# Patient Record
Sex: Female | Born: 1985 | Race: White | Hispanic: No | Marital: Married
Health system: Southern US, Community
[De-identification: ages and names within clinical notes are randomized; demographics above are authoritative.]

## PROBLEM LIST (undated history)

## (undated) DIAGNOSIS — A609 Anogenital herpesviral infection, unspecified: Secondary | ICD-10-CM

## (undated) DIAGNOSIS — E7212 Methylenetetrahydrofolate reductase deficiency: Secondary | ICD-10-CM

## (undated) DIAGNOSIS — J309 Allergic rhinitis, unspecified: Secondary | ICD-10-CM

## (undated) DIAGNOSIS — K589 Irritable bowel syndrome without diarrhea: Secondary | ICD-10-CM

## (undated) DIAGNOSIS — Z1589 Genetic susceptibility to other disease: Secondary | ICD-10-CM

## (undated) HISTORY — DX: Allergic rhinitis, unspecified: J30.9

## (undated) HISTORY — DX: Irritable bowel syndrome without diarrhea: K58.9

## (undated) HISTORY — PX: TONSILLECTOMY: SUR1361

## (undated) HISTORY — DX: Anogenital herpesviral infection, unspecified: A60.9

---
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
## 2013-12-09 ENCOUNTER — Emergency Department (HOSPITAL_COMMUNITY): Payer: BC Managed Care – PPO

## 2013-12-09 ENCOUNTER — Encounter (HOSPITAL_COMMUNITY): Payer: Self-pay | Admitting: Emergency Medicine

## 2013-12-09 ENCOUNTER — Emergency Department (HOSPITAL_COMMUNITY)
Admission: EM | Admit: 2013-12-09 | Discharge: 2013-12-09 | Disposition: A | Discharge status: Home / Self Care | Payer: BC Managed Care – PPO | Attending: Emergency Medicine | Admitting: Emergency Medicine

## 2013-12-09 DIAGNOSIS — O99330 Smoking (tobacco) complicating pregnancy, unspecified trimester: Secondary | ICD-10-CM | POA: Insufficient documentation

## 2013-12-09 DIAGNOSIS — R1032 Left lower quadrant pain: Secondary | ICD-10-CM | POA: Insufficient documentation

## 2013-12-09 DIAGNOSIS — Z79899 Other long term (current) drug therapy: Secondary | ICD-10-CM | POA: Diagnosis not present

## 2013-12-09 DIAGNOSIS — O9989 Other specified diseases and conditions complicating pregnancy, childbirth and the puerperium: Secondary | ICD-10-CM | POA: Insufficient documentation

## 2013-12-09 DIAGNOSIS — O210 Mild hyperemesis gravidarum: Secondary | ICD-10-CM | POA: Diagnosis not present

## 2013-12-09 DIAGNOSIS — N949 Unspecified condition associated with female genital organs and menstrual cycle: Secondary | ICD-10-CM | POA: Insufficient documentation

## 2013-12-09 DIAGNOSIS — Z3490 Encounter for supervision of normal pregnancy, unspecified, unspecified trimester: Secondary | ICD-10-CM

## 2013-12-09 LAB — CBC WITH DIFFERENTIAL/PLATELET
BASOS PCT: 0 % (ref 0–1)
Basophils Absolute: 0 10*3/uL (ref 0.0–0.1)
EOS ABS: 0 10*3/uL (ref 0.0–0.7)
EOS PCT: 0 % (ref 0–5)
HCT: 38.9 % (ref 36.0–46.0)
Hemoglobin: 13.4 g/dL (ref 12.0–15.0)
Lymphocytes Relative: 10 % — ABNORMAL LOW (ref 12–46)
Lymphs Abs: 1 10*3/uL (ref 0.7–4.0)
MCH: 31.3 pg (ref 26.0–34.0)
MCHC: 34.4 g/dL (ref 30.0–36.0)
MCV: 90.9 fL (ref 78.0–100.0)
Monocytes Absolute: 0.5 10*3/uL (ref 0.1–1.0)
Monocytes Relative: 5 % (ref 3–12)
Neutro Abs: 8.3 10*3/uL — ABNORMAL HIGH (ref 1.7–7.7)
Neutrophils Relative %: 85 % — ABNORMAL HIGH (ref 43–77)
PLATELETS: 254 10*3/uL (ref 150–400)
RBC: 4.28 MIL/uL (ref 3.87–5.11)
RDW: 12 % (ref 11.5–15.5)
WBC: 9.9 10*3/uL (ref 4.0–10.5)

## 2013-12-09 LAB — WET PREP, GENITAL
TRICH WET PREP: NONE SEEN
Yeast Wet Prep HPF POC: NONE SEEN

## 2013-12-09 LAB — I-STAT CHEM 8, ED
BUN: 15 mg/dL (ref 6–23)
Calcium, Ion: 1.11 mmol/L — ABNORMAL LOW (ref 1.12–1.23)
Chloride: 103 mEq/L (ref 96–112)
Creatinine, Ser: 0.6 mg/dL (ref 0.50–1.10)
GLUCOSE: 108 mg/dL — AB (ref 70–99)
HEMATOCRIT: 41 % (ref 36.0–46.0)
HEMOGLOBIN: 13.9 g/dL (ref 12.0–15.0)
POTASSIUM: 3.6 meq/L — AB (ref 3.7–5.3)
SODIUM: 137 meq/L (ref 137–147)
TCO2: 22 mmol/L (ref 0–100)

## 2013-12-09 LAB — URINALYSIS, ROUTINE W REFLEX MICROSCOPIC
Bilirubin Urine: NEGATIVE
Glucose, UA: NEGATIVE mg/dL
Hgb urine dipstick: NEGATIVE
Ketones, ur: >80 mg/dL — AB
LEUKOCYTES UA: NEGATIVE
NITRITE: NEGATIVE
PH: 7.5 (ref 5.0–8.0)
Protein, ur: NEGATIVE mg/dL
SPECIFIC GRAVITY, URINE: 1.025 (ref 1.005–1.030)
UROBILINOGEN UA: 1 mg/dL (ref 0.0–1.0)

## 2013-12-09 LAB — HCG, QUANTITATIVE, PREGNANCY: hCG, Beta Chain, Quant, S: 80 m[IU]/mL — ABNORMAL HIGH (ref <5)

## 2013-12-09 LAB — ABO/RH
ABO/RH(D): O NEG
ANTIBODY SCREEN: NEGATIVE

## 2013-12-09 LAB — POC URINE PREG, ED: PREG TEST UR: NEGATIVE

## 2013-12-09 MED ORDER — MORPHINE SULFATE 4 MG/ML IJ SOLN
4.0000 mg | Freq: Once | INTRAMUSCULAR | Status: AC
  Administered 2013-12-09: 4 mg via INTRAVENOUS
  Filled 2013-12-09: qty 1

## 2013-12-09 MED ORDER — PROMETHAZINE HCL 25 MG/ML IJ SOLN
6.2500 mg | Freq: Once | INTRAMUSCULAR | Status: AC
  Administered 2013-12-09: 6.25 mg via INTRAVENOUS
  Filled 2013-12-09: qty 1

## 2013-12-09 MED ORDER — HYDROCODONE-ACETAMINOPHEN 5-325 MG PO TABS
1.0000 | ORAL_TABLET | ORAL | Status: DC | PRN
Start: 2013-12-09 — End: 2015-02-24

## 2013-12-09 MED ORDER — SODIUM CHLORIDE 0.9 % IV BOLUS (SEPSIS)
1000.0000 mL | Freq: Once | INTRAVENOUS | Status: AC
  Administered 2013-12-09: 1000 mL via INTRAVENOUS

## 2013-12-09 NOTE — ED Provider Notes (Signed)
CSN: [id number]     Arrival date & time 12/09/13  0140 History   First MD Initiated Contact with Patient 12/09/13 0229     Chief Complaint  Patient presents with  . Abdominal Pain     (Consider location/radiation/quality/duration/timing/severity/associated sxs/prior Treatment) HPI Ashley Chapman is a 28 y.o. female who presents to emergency department complaining of left lower quadrant pain. Patient states her pain began approximately 10 hours ago. States he has worsened since. States pain is in the left pelvic area, does not radiate. She states that she took 2 pregnancy tests yesterday both came back positive. Last menstrual cycle was 11/04/2013. This is her first pregnancy. She denies any vaginal discharge or bleeding. She does admit to nausea and vomiting. Denies any changes in bowels. No history of similar pain in the past. She did not take anything for pain at home prior to coming in. She also reports urinary frequency and dysuria. No hematuria.  History reviewed. No pertinent past medical history. Past Surgical History  Procedure Laterality Date  . Tonsillectomy     No family history on file. History  Substance Use Topics  . Smoking status: Current Every Day Smoker  . Smokeless tobacco: Not on file  . Alcohol Use: No   OB History   Grav Para Term Preterm Abortions TAB SAB Ect Mult Living                 Review of Systems  Constitutional: Negative for fever and chills.  Respiratory: Negative for cough, chest tightness and shortness of breath.   Cardiovascular: Negative for chest pain, palpitations and leg swelling.  Gastrointestinal: Positive for nausea, vomiting and abdominal pain. Negative for diarrhea.  Genitourinary: Positive for pelvic pain. Negative for dysuria, flank pain, vaginal bleeding, vaginal discharge and vaginal pain.  Musculoskeletal: Negative for arthralgias, myalgias, neck pain and neck stiffness.  Skin: Negative for rash.  Neurological: Negative for  dizziness, weakness and headaches.  All other systems reviewed and are negative.     Allergies  Review of patient's allergies indicates no known allergies.  Home Medications   Prior to Admission medications   Medication Sig Start Date End Date Taking? Authorizing Provider  Multiple Vitamin (MULTIVITAMIN WITH MINERALS) TABS tablet Take 1 tablet by mouth daily.   Yes Historical Provider, MD   BP 125/82  Pulse 61  Temp(Src) 98.7 F (37.1 C) (Oral)  Resp 19  Ht  (1.651 m)  Wt 160 lb (72.576 kg)  BMI 26.63 kg/m2  SpO2 100%  LMP 11/04/2013 Physical Exam  Nursing note and vitals reviewed. Constitutional: She appears well-developed and well-nourished. No distress.  HENT:  Head: Normocephalic.  Eyes: Conjunctivae are normal.  Neck: Neck supple.  Cardiovascular: Normal rate, regular rhythm and normal heart sounds.   Pulmonary/Chest: Effort normal and breath sounds normal. No respiratory distress. She has no wheezes. She has no rales.  Abdominal: Soft. Bowel sounds are normal. She exhibits no distension. There is tenderness. There is no rebound and no guarding.  Left lower quadrant tenderness  Genitourinary:  Normal external genitalia. Normal vaginal canal. Small thin white discharge. Cervix is normal, closed. No CMT. No uterine tenderness. Left adnexal tenderness present. No masses palpated.    Musculoskeletal: She exhibits no edema.  Neurological: She is alert.  Skin: Skin is warm and dry.  Psychiatric: She has a normal mood and affect. Her behavior is normal.    ED Course  Procedures (including critical care time) Labs Review Labs Reviewed  WET PREP,  GENITAL - Abnormal; Notable for the following:    Clue Cells Wet Prep HPF POC FEW (*)    WBC, Wet Prep HPF POC FEW (*)    All other components within normal limits  URINALYSIS, ROUTINE W REFLEX MICROSCOPIC - Abnormal; Notable for the following:    APPearance CLOUDY (*)    Ketones, ur >80 (*)    All other components  within normal limits  CBC WITH DIFFERENTIAL - Abnormal; Notable for the following:    Neutrophils Relative % 85 (*)    Neutro Abs 8.3 (*)    Lymphocytes Relative 10 (*)    All other components within normal limits  HCG, QUANTITATIVE, PREGNANCY - Abnormal; Notable for the following:    hCG, Beta Chain, Quant, S 80 (*)    All other components within normal limits  I-STAT CHEM 8, ED - Abnormal; Notable for the following:    Potassium 3.6 (*)    Glucose, Bld 108 (*)    Calcium, Ion 1.11 (*)    All other components within normal limits  GC/CHLAMYDIA PROBE AMP  POC URINE PREG, ED  ABO/RH    Imaging Review US Ob Comp Less 14 Wks  12/09/2013   CLINICAL DATA:  LEFT adnexal pain, assess for ectopic pregnancy. Gestational age by last menstrual period 5 weeks and 0 days.  EXAM: OBSTETRIC <14 WK [place] AND TRANSVAGINAL OB  US DOPPLER ULTRASOUND OF OVARIES  TECHNIQUE: Both transabdominal and transvaginal ultrasound examinations were performed for complete evaluation of the gestation as well as the maternal uterus, adnexal regions, and pelvic cul-de-sac. Transvaginal technique was performed to assess early pregnancy.  Color and duplex Doppler ultrasound was utilized to evaluate blood flow to the ovaries.  COMPARISON:  None.  FINDINGS: Intrauterine gestational sac: Not visualized  Yolk sac:  Not visualized  Embryo:  Not visualized  Cardiac Activity: Not applicable  Maternal uterus/adnexae: The RIGHT ovary is 3.6 x 2 x 3 cm, LEFT ovary is 2.9 x 1.6 x 1.5 cm. No suspicious adnexal masses. Small to moderate amount of free fluid in the pelvis.  Pulsed Doppler evaluation of both ovaries demonstrates normal appearing low-resistance arterial and venous waveforms.  IMPRESSION: No sonographic findings of intrauterine nor ectopic pregnancy. Please note, gestational sac may be sonographically occult in early pregnancy. If clinically indicated, consider serial HCG and follow-up pelvic ultrasound in 10-12 days.  Small to  moderate around of free fluid in the pelvis could reflect a ruptured adnexal cyst.   Electronically Signed   By: Awilda Metro   On: 12/09/2013 06:00   US Ob Transvaginal  12/09/2013   CLINICAL DATA:  LEFT adnexal pain, assess for ectopic pregnancy. Gestational age by last menstrual period 5 weeks and 0 days.  EXAM: OBSTETRIC <14 WK [place] AND TRANSVAGINAL OB  US DOPPLER ULTRASOUND OF OVARIES  TECHNIQUE: Both transabdominal and transvaginal ultrasound examinations were performed for complete evaluation of the gestation as well as the maternal uterus, adnexal regions, and pelvic cul-de-sac. Transvaginal technique was performed to assess early pregnancy.  Color and duplex Doppler ultrasound was utilized to evaluate blood flow to the ovaries.  COMPARISON:  None.  FINDINGS: Intrauterine gestational sac: Not visualized  Yolk sac:  Not visualized  Embryo:  Not visualized  Cardiac Activity: Not applicable  Maternal uterus/adnexae: The RIGHT ovary is 3.6 x 2 x 3 cm, LEFT ovary is 2.9 x 1.6 x 1.5 cm. No suspicious adnexal masses. Small to moderate amount of free fluid in the pelvis.  Pulsed Doppler  evaluation of both ovaries demonstrates normal appearing low-resistance arterial and venous waveforms.  IMPRESSION: No sonographic findings of intrauterine nor ectopic pregnancy. Please note, gestational sac may be sonographically occult in early pregnancy. If clinically indicated, consider serial HCG and follow-up pelvic ultrasound in 10-12 days.  Small to moderate around of free fluid in the pelvis could reflect a ruptured adnexal cyst.   Electronically Signed   By: Awilda Metro   On: 12/09/2013 06:00   [place] Art/ven Flow Abd Pelv Doppler  12/09/2013   CLINICAL DATA:  LEFT adnexal pain, assess for ectopic pregnancy. Gestational age by last menstrual period 5 weeks and 0 days.  EXAM: OBSTETRIC <14 WK [place] AND TRANSVAGINAL OB  US DOPPLER ULTRASOUND OF OVARIES  TECHNIQUE: Both transabdominal and transvaginal ultrasound  examinations were performed for complete evaluation of the gestation as well as the maternal uterus, adnexal regions, and pelvic cul-de-sac. Transvaginal technique was performed to assess early pregnancy.  Color and duplex Doppler ultrasound was utilized to evaluate blood flow to the ovaries.  COMPARISON:  None.  FINDINGS: Intrauterine gestational sac: Not visualized  Yolk sac:  Not visualized  Embryo:  Not visualized  Cardiac Activity: Not applicable  Maternal uterus/adnexae: The RIGHT ovary is 3.6 x 2 x 3 cm, LEFT ovary is 2.9 x 1.6 x 1.5 cm. No suspicious adnexal masses. Small to moderate amount of free fluid in the pelvis.  Pulsed Doppler evaluation of both ovaries demonstrates normal appearing low-resistance arterial and venous waveforms.  IMPRESSION: No sonographic findings of intrauterine nor ectopic pregnancy. Please note, gestational sac may be sonographically occult in early pregnancy. If clinically indicated, consider serial HCG and follow-up pelvic ultrasound in 10-12 days.  Small to moderate around of free fluid in the pelvis could reflect a ruptured adnexal cyst.   Electronically Signed   By: Awilda Metro   On: 12/09/2013 06:00     EKG Interpretation None      MDM   Final diagnoses:  Left lower quadrant pain  Pregnancy    Patient's with 2 positive pregnancy test at home, here with left adnexal pain. Patient's urinalysis here showed negative pregnancy test. Will get blood hCG level, will get ultrasound for further evaluation.     Patient's ultrasound showed no sonographic findings of intrauterine pregnancy, no ectopic pregnancy seen as well. It may be too early to see pregnancy given her hCG level is 80. Have discussed findings with patient. Have discussed the fact that we cannot still rule out ectopic pregnancy. She will need close followup with her OB/GYN in 2 days for recheck of hCG. Patient is followed by [facility], she will call them and followup with them, if unable  to followup in 2 days she is to report to [facility] hospital. She will also need repeat ultrasound in 10-12 days as recommended by radiologist. At this time patient is more comfortable, pain is manageable. We'll discharge home with pain medications. Advised to go to [facility] hospital if any vaginal bleeding, increased pain, any new concerning symptoms. Pt agrees to the plan. Pt non toxic at this time. VS normal.   Filed Vitals:   12/09/13 0155 12/09/13 0537  BP: 125/82 107/67  Pulse: 61 66  Temp: 98.7 F (37.1 C)   TempSrc: Oral   Resp: 19 16  Height:  (1.651 m)   Weight: 160 lb (72.576 kg)   SpO2: 100% 100%     Lottie Mussel, PA-C 12/09/13 2114

## 2013-12-09 NOTE — ED Notes (Signed)
U/s at bedside

## 2013-12-09 NOTE — Discharge Instructions (Signed)
Your hcg today is 21, which is pretty low and consistent with 2-[redacted] wks gestation. Your ultrasound did not show an embryo because it may be too early. I is imperative to follow up in 2 days for recheck of HCG to make sure pregnancy progresses and repeat ultrasound to rule out ectopic/tubal pregnancy. Take tylenol for pain. Norco for severe pain only. [facility] if worsening pain, fever, vaginal bleeding.    Abdominal Pain During Pregnancy Abdominal pain is common in pregnancy. Most of the time, it does not cause harm. There are many causes of abdominal pain. Some causes are more serious than others. Some of the causes of abdominal pain in pregnancy are easily diagnosed. Occasionally, the diagnosis takes time to understand. Other times, the cause is not determined. Abdominal pain can be a sign that something is very wrong with the pregnancy, or the pain may have nothing to do with the pregnancy at all. For this reason, always tell your health care provider if you have any abdominal discomfort. HOME CARE INSTRUCTIONS  Monitor your abdominal pain for any changes. The following actions may help to alleviate any discomfort you are experiencing:  Do not have sexual intercourse or put anything in your vagina until your symptoms go away completely.  Get plenty of rest until your pain improves.  Drink clear fluids if you feel nauseous. Avoid solid food as long as you are uncomfortable or nauseous.  Only take over-the-counter or prescription medicine as directed by your health care provider.  Keep all follow-up appointments with your health care provider. SEEK IMMEDIATE MEDICAL CARE IF:  You are bleeding, leaking fluid, or passing tissue from the vagina.  You have increasing pain or cramping.  You have persistent vomiting.  You have painful or bloody urination.  You have a fever.  You notice a decrease in your baby's movements.  You have extreme weakness or feel faint.  You  have shortness of breath, with or without abdominal pain.  You develop a severe headache with abdominal pain.  You have abnormal vaginal discharge with abdominal pain.  You have persistent diarrhea.  You have abdominal pain that continues even after rest, or gets worse. MAKE SURE YOU:   Understand these instructions.  Will watch your condition.  Will get help right away if you are not doing well or get worse. Document Released: 03/04/2005 Document Revised: 12/23/2012 Document Reviewed: 10/01/2012 [facility] Patient Information 2015 [place], [place]. This information is not intended to replace advice given to you by your health care provider. Make sure you discuss any questions you have with your health care provider.

## 2013-12-09 NOTE — ED Notes (Signed)
Pt reports L pelvic pain with n/v since 1600 yesterday afternoon.  Pt reports she is [redacted] weeks pregnant.  Denies any bleeding but reports dysuria.

## 2013-12-09 NOTE — ED Notes (Signed)
Pt presents with LLQ abd pain onset yesteday @ 1600, + n/v, +dysuria. Pt states she took home preg test x 2 with pos result. LMP 8/20. Abd tender, worse LLQ. Diffuse tenderness.

## 2013-12-10 LAB — GC/CHLAMYDIA PROBE AMP
CT Probe RNA: NEGATIVE
GC Probe RNA: NEGATIVE

## 2013-12-10 NOTE — ED Provider Notes (Signed)
Medical screening examination/treatment/procedure(s) were performed by non-physician practitioner and as supervising physician I was immediately available for consultation/collaboration.   EKG Interpretation None        Purvis Sheffield, MD 12/10/13 1707

## 2014-08-01 LAB — OB RESULTS CONSOLE ABO/RH: RH TYPE: NEGATIVE

## 2014-08-01 LAB — OB RESULTS CONSOLE ANTIBODY SCREEN: Antibody Screen: NEGATIVE

## 2014-08-01 LAB — OB RESULTS CONSOLE RPR: RPR: NONREACTIVE

## 2014-08-01 LAB — OB RESULTS CONSOLE GC/CHLAMYDIA
CHLAMYDIA, DNA PROBE: NEGATIVE
Gonorrhea: NEGATIVE

## 2014-08-01 LAB — OB RESULTS CONSOLE RUBELLA ANTIBODY, IGM: Rubella: IMMUNE

## 2014-08-01 LAB — OB RESULTS CONSOLE HEPATITIS B SURFACE ANTIGEN: Hepatitis B Surface Ag: NEGATIVE

## 2014-08-01 LAB — OB RESULTS CONSOLE HIV ANTIBODY (ROUTINE TESTING): HIV: NONREACTIVE

## 2015-01-26 LAB — OB RESULTS CONSOLE GBS: STREP GROUP B AG: POSITIVE

## 2015-02-23 ENCOUNTER — Encounter (HOSPITAL_COMMUNITY): Payer: Self-pay | Admitting: *Deleted

## 2015-02-23 ENCOUNTER — Inpatient Hospital Stay (HOSPITAL_COMMUNITY)
Admission: AD | Admit: 2015-02-23 | Payer: Managed Care, Other (non HMO) | Source: Ambulatory Visit | Admitting: Obstetrics and Gynecology

## 2015-02-23 ENCOUNTER — Other Ambulatory Visit: Payer: Self-pay | Admitting: Certified Nurse Midwife

## 2015-02-23 ENCOUNTER — Telehealth (HOSPITAL_COMMUNITY): Payer: Self-pay | Admitting: *Deleted

## 2015-02-23 NOTE — Telephone Encounter (Signed)
Preadmission screen  

## 2015-02-24 ENCOUNTER — Inpatient Hospital Stay (HOSPITAL_COMMUNITY): Payer: BLUE CROSS/BLUE SHIELD | Admitting: Anesthesiology

## 2015-02-24 ENCOUNTER — Inpatient Hospital Stay (HOSPITAL_COMMUNITY)
Admission: RE | Admit: 2015-02-24 | Discharge: 2015-02-26 | DRG: 766 | Disposition: A | Discharge status: Home / Self Care | Payer: BLUE CROSS/BLUE SHIELD | Source: Ambulatory Visit | Attending: Obstetrics | Admitting: Obstetrics

## 2015-02-24 ENCOUNTER — Encounter (HOSPITAL_COMMUNITY): Payer: Self-pay

## 2015-02-24 DIAGNOSIS — O1424 HELLP syndrome, complicating childbirth: Secondary | ICD-10-CM | POA: Diagnosis present

## 2015-02-24 DIAGNOSIS — O2603 Excessive weight gain in pregnancy, third trimester: Secondary | ICD-10-CM | POA: Diagnosis present

## 2015-02-24 DIAGNOSIS — Z1589 Genetic susceptibility to other disease: Secondary | ICD-10-CM | POA: Diagnosis present

## 2015-02-24 DIAGNOSIS — O139 Gestational [pregnancy-induced] hypertension without significant proteinuria, unspecified trimester: Secondary | ICD-10-CM | POA: Diagnosis present

## 2015-02-24 DIAGNOSIS — Z800 Family history of malignant neoplasm of digestive organs: Secondary | ICD-10-CM | POA: Diagnosis not present

## 2015-02-24 DIAGNOSIS — O99824 Streptococcus B carrier state complicating childbirth: Secondary | ICD-10-CM | POA: Diagnosis present

## 2015-02-24 DIAGNOSIS — O2620 Pregnancy care for patient with recurrent pregnancy loss, unspecified trimester: Secondary | ICD-10-CM | POA: Diagnosis present

## 2015-02-24 DIAGNOSIS — O480 Post-term pregnancy: Secondary | ICD-10-CM | POA: Diagnosis present

## 2015-02-24 DIAGNOSIS — O134 Gestational [pregnancy-induced] hypertension without significant proteinuria, complicating childbirth: Secondary | ICD-10-CM | POA: Diagnosis present

## 2015-02-24 DIAGNOSIS — O3660X0 Maternal care for excessive fetal growth, unspecified trimester, not applicable or unspecified: Secondary | ICD-10-CM | POA: Diagnosis present

## 2015-02-24 DIAGNOSIS — Z3A40 40 weeks gestation of pregnancy: Secondary | ICD-10-CM

## 2015-02-24 DIAGNOSIS — Z842 Family history of other diseases of the genitourinary system: Secondary | ICD-10-CM

## 2015-02-24 DIAGNOSIS — E7212 Methylenetetrahydrofolate reductase deficiency: Secondary | ICD-10-CM | POA: Diagnosis present

## 2015-02-24 HISTORY — DX: Methylenetetrahydrofolate reductase deficiency: E72.12

## 2015-02-24 HISTORY — DX: Genetic susceptibility to other disease: Z15.89

## 2015-02-24 LAB — COMPREHENSIVE METABOLIC PANEL
ALT: 17 U/L (ref 14–54)
AST: 23 U/L (ref 15–41)
Albumin: 3.3 g/dL — ABNORMAL LOW (ref 3.5–5.0)
Alkaline Phosphatase: 116 U/L (ref 38–126)
Anion gap: 9 (ref 5–15)
BUN: 17 mg/dL (ref 6–20)
CO2: 19 mmol/L — ABNORMAL LOW (ref 22–32)
Calcium: 8.8 mg/dL — ABNORMAL LOW (ref 8.9–10.3)
Chloride: 103 mmol/L (ref 101–111)
Creatinine, Ser: 0.64 mg/dL (ref 0.44–1.00)
GFR calc Af Amer: >60 mL/min (ref >60)
GFR calc non Af Amer: >60 mL/min (ref >60)
Glucose, Bld: 89 mg/dL (ref 65–99)
Potassium: 3.8 mmol/L (ref 3.5–5.1)
Sodium: 131 mmol/L — ABNORMAL LOW (ref 135–145)
Total Bilirubin: 1.5 mg/dL — ABNORMAL HIGH (ref 0.3–1.2)
Total Protein: 6.3 g/dL — ABNORMAL LOW (ref 6.5–8.1)

## 2015-02-24 LAB — CBC
HCT: 38.5 % (ref 36.0–46.0)
HCT: 37 % (ref 36.0–46.0)
HCT: 38.5 % (ref 36.0–46.0)
HEMOGLOBIN: 13.2 g/dL (ref 12.0–15.0)
Hemoglobin: 13.1 g/dL (ref 12.0–15.0)
Hemoglobin: 12.6 g/dL (ref 12.0–15.0)
MCH: 31.5 pg (ref 26.0–34.0)
MCH: 31.7 pg (ref 26.0–34.0)
MCH: 32 pg (ref 26.0–34.0)
MCHC: 34 g/dL (ref 30.0–36.0)
MCHC: 34.1 g/dL (ref 30.0–36.0)
MCHC: 34.3 g/dL (ref 30.0–36.0)
MCV: 92.5 fL (ref 78.0–100.0)
MCV: 93.2 fL (ref 78.0–100.0)
MCV: 93.4 fL (ref 78.0–100.0)
PLATELETS: 151 10*3/uL (ref 150–400)
Platelets: 165 10*3/uL (ref 150–400)
Platelets: 140 10*3/uL — ABNORMAL LOW (ref 150–400)
RBC: 4.16 MIL/uL (ref 3.87–5.11)
RBC: 3.97 MIL/uL (ref 3.87–5.11)
RBC: 4.12 MIL/uL (ref 3.87–5.11)
RDW: 13.8 % (ref 11.5–15.5)
RDW: 13.9 % (ref 11.5–15.5)
RDW: 14.1 % (ref 11.5–15.5)
WBC: 16.3 10*3/uL — ABNORMAL HIGH (ref 4.0–10.5)
WBC: 12.7 10*3/uL — AB (ref 4.0–10.5)
WBC: 18 10*3/uL — ABNORMAL HIGH (ref 4.0–10.5)

## 2015-02-24 LAB — RPR: RPR Ser Ql: NONREACTIVE

## 2015-02-24 LAB — URIC ACID: Uric Acid, Serum: 6.4 mg/dL (ref 2.3–6.6)

## 2015-02-24 MED ORDER — SCOPOLAMINE 1 MG/3DAYS TD PT72
MEDICATED_PATCH | TRANSDERMAL | Status: AC
  Filled 2015-02-24: qty 1

## 2015-02-24 MED ORDER — NALOXONE HCL 0.4 MG/ML IJ SOLN: 0.4000 mg | INTRAMUSCULAR | Status: DC | PRN

## 2015-02-24 MED ORDER — DIPHENHYDRAMINE HCL 50 MG/ML IJ SOLN
12.5000 mg | INTRAMUSCULAR | Status: DC | PRN
Start: 2015-02-24 — End: 2015-02-24

## 2015-02-24 MED ORDER — BUPIVACAINE LIPOSOME 1.3 % IJ SUSP
20.0000 mL | Freq: Once | INTRAMUSCULAR | Status: DC
  Filled 2015-02-24: qty 20

## 2015-02-24 MED ORDER — ONDANSETRON HCL 4 MG/2ML IJ SOLN
INTRAMUSCULAR | Status: DC | PRN
  Administered 2015-02-24: 4 mg via INTRAVENOUS

## 2015-02-24 MED ORDER — LACTATED RINGERS IV SOLN
INTRAVENOUS | Status: DC | PRN
  Administered 2015-02-24: 20:00:00 via INTRAVENOUS

## 2015-02-24 MED ORDER — MISOPROSTOL 25 MCG QUARTER TABLET: 25.0000 ug | ORAL_TABLET | ORAL | Status: DC | PRN

## 2015-02-24 MED ORDER — DIPHENHYDRAMINE HCL 25 MG PO CAPS
25.0000 mg | ORAL_CAPSULE | Freq: Four times a day (QID) | ORAL | Status: DC | PRN
  Filled 2015-02-24: qty 1

## 2015-02-24 MED ORDER — MISOPROSTOL 25 MCG QUARTER TABLET
25.0000 ug | ORAL_TABLET | ORAL | Status: DC | PRN
  Administered 2015-02-24: 25 ug via VAGINAL
  Filled 2015-02-24 (×2): qty 0.25

## 2015-02-24 MED ORDER — SODIUM CHLORIDE 0.9 % IJ SOLN: 3.0000 mL | INTRAMUSCULAR | Status: DC | PRN

## 2015-02-24 MED ORDER — LACTATED RINGERS IV SOLN
INTRAVENOUS | Status: DC
  Administered 2015-02-24: 18:00:00 via INTRAVENOUS

## 2015-02-24 MED ORDER — PENICILLIN G POTASSIUM 5000000 UNITS IJ SOLR
5.0000 10*6.[IU] | Freq: Once | INTRAVENOUS | Status: DC
  Filled 2015-02-24: qty 5

## 2015-02-24 MED ORDER — SODIUM BICARBONATE 8.4 % IV SOLN
INTRAVENOUS | Status: DC | PRN
  Administered 2015-02-24 (×2): 5 mL via EPIDURAL

## 2015-02-24 MED ORDER — SIMETHICONE 80 MG PO CHEW
80.0000 mg | CHEWABLE_TABLET | ORAL | Status: DC | PRN
Start: 2015-02-24 — End: 2015-02-26
  Filled 2015-02-24: qty 1

## 2015-02-24 MED ORDER — NALBUPHINE HCL 10 MG/ML IJ SOLN: 5.0000 mg | INTRAMUSCULAR | Status: DC | PRN

## 2015-02-24 MED ORDER — OXYCODONE-ACETAMINOPHEN 5-325 MG PO TABS: 1.0000 | ORAL_TABLET | ORAL | Status: DC | PRN

## 2015-02-24 MED ORDER — NALBUPHINE HCL 10 MG/ML IJ SOLN
5.0000 mg | INTRAMUSCULAR | Status: DC | PRN
Start: 2015-02-24 — End: 2015-02-25

## 2015-02-24 MED ORDER — OXYTOCIN 40 UNITS IN LACTATED RINGERS INFUSION - SIMPLE MED: 62.5000 mL/h | INTRAVENOUS | Status: DC

## 2015-02-24 MED ORDER — OXYTOCIN BOLUS FROM INFUSION: 500.0000 mL | INTRAVENOUS | Status: DC

## 2015-02-24 MED ORDER — WITCH HAZEL-GLYCERIN EX PADS
1.0000 | MEDICATED_PAD | CUTANEOUS | Status: DC | PRN
Start: 2015-02-24 — End: 2015-02-26

## 2015-02-24 MED ORDER — MENTHOL 3 MG MT LOZG
1.0000 | LOZENGE | OROMUCOSAL | Status: DC | PRN
  Filled 2015-02-24: qty 9

## 2015-02-24 MED ORDER — MORPHINE SULFATE (PF) 0.5 MG/ML IJ SOLN
INTRAMUSCULAR | Status: DC | PRN
  Administered 2015-02-24: 4 mg via EPIDURAL
  Administered 2015-02-24: 1 mg via INTRAVENOUS

## 2015-02-24 MED ORDER — DIPHENHYDRAMINE HCL 50 MG/ML IJ SOLN: 12.5000 mg | INTRAMUSCULAR | Status: DC | PRN

## 2015-02-24 MED ORDER — MEPERIDINE HCL 25 MG/ML IJ SOLN: 6.2500 mg | INTRAMUSCULAR | Status: DC | PRN

## 2015-02-24 MED ORDER — NALBUPHINE HCL 10 MG/ML IJ SOLN: 5.0000 mg | Freq: Once | INTRAMUSCULAR | Status: DC | PRN

## 2015-02-24 MED ORDER — PENICILLIN G POTASSIUM 5000000 UNITS IJ SOLR
2.5000 10*6.[IU] | INTRAVENOUS | Status: DC
  Filled 2015-02-24 (×4): qty 2.5

## 2015-02-24 MED ORDER — DEXTROSE 5 % IV SOLN
2.0000 g | INTRAVENOUS | Status: AC
  Administered 2015-02-24: 2 g via INTRAVENOUS
  Filled 2015-02-24: qty 2

## 2015-02-24 MED ORDER — PRENATAL MULTIVITAMIN CH
1.0000 | ORAL_TABLET | Freq: Every day | ORAL | Status: DC
  Administered 2015-02-25 – 2015-02-26 (×2): 1 via ORAL
  Filled 2015-02-24 (×3): qty 1

## 2015-02-24 MED ORDER — SODIUM BICARBONATE 8.4 % IV SOLN
INTRAVENOUS | Status: AC
  Filled 2015-02-24: qty 50

## 2015-02-24 MED ORDER — LACTATED RINGERS IV SOLN
INTRAVENOUS | Status: DC
  Administered 2015-02-24 – 2015-02-25 (×2): via INTRAVENOUS

## 2015-02-24 MED ORDER — METHYLERGONOVINE MALEATE 0.2 MG/ML IJ SOLN: 0.2000 mg | INTRAMUSCULAR | Status: DC | PRN

## 2015-02-24 MED ORDER — LANOLIN HYDROUS EX OINT: 1.0000 "application " | TOPICAL_OINTMENT | CUTANEOUS | Status: DC | PRN

## 2015-02-24 MED ORDER — SCOPOLAMINE 1 MG/3DAYS TD PT72
MEDICATED_PATCH | TRANSDERMAL | Status: DC | PRN
  Administered 2015-02-24: 1 via TRANSDERMAL

## 2015-02-24 MED ORDER — IBUPROFEN 600 MG PO TABS
600.0000 mg | ORAL_TABLET | Freq: Four times a day (QID) | ORAL | Status: DC
  Administered 2015-02-25 – 2015-02-26 (×6): 600 mg via ORAL
  Filled 2015-02-24 (×6): qty 1

## 2015-02-24 MED ORDER — OXYCODONE-ACETAMINOPHEN 5-325 MG PO TABS: 2.0000 | ORAL_TABLET | ORAL | Status: DC | PRN

## 2015-02-24 MED ORDER — CITRIC ACID-SODIUM CITRATE 334-500 MG/5ML PO SOLN
30.0000 mL | ORAL | Status: DC | PRN
  Filled 2015-02-24: qty 15

## 2015-02-24 MED ORDER — ACETAMINOPHEN 325 MG PO TABS
650.0000 mg | ORAL_TABLET | ORAL | Status: DC | PRN
  Administered 2015-02-25 (×2): 650 mg via ORAL
  Filled 2015-02-24 (×2): qty 2

## 2015-02-24 MED ORDER — BUPIVACAINE HCL (PF) 0.25 % IJ SOLN
INTRAMUSCULAR | Status: AC
  Filled 2015-02-24: qty 20

## 2015-02-24 MED ORDER — OXYTOCIN 40 UNITS IN LACTATED RINGERS INFUSION - SIMPLE MED
1.0000 m[IU]/min | INTRAVENOUS | Status: DC
  Administered 2015-02-24: 2 m[IU]/min via INTRAVENOUS
  Filled 2015-02-24: qty 1000

## 2015-02-24 MED ORDER — DIPHENHYDRAMINE HCL 25 MG PO CAPS
25.0000 mg | ORAL_CAPSULE | ORAL | Status: DC | PRN
  Filled 2015-02-24: qty 1

## 2015-02-24 MED ORDER — ACETAMINOPHEN 325 MG PO TABS: 650.0000 mg | ORAL_TABLET | ORAL | Status: DC | PRN

## 2015-02-24 MED ORDER — SIMETHICONE 80 MG PO CHEW
80.0000 mg | CHEWABLE_TABLET | Freq: Three times a day (TID) | ORAL | Status: DC
  Administered 2015-02-25 – 2015-02-26 (×3): 80 mg via ORAL
  Filled 2015-02-24 (×7): qty 1

## 2015-02-24 MED ORDER — SCOPOLAMINE 1 MG/3DAYS TD PT72: 1.0000 | MEDICATED_PATCH | Freq: Once | TRANSDERMAL | Status: DC

## 2015-02-24 MED ORDER — KETOROLAC TROMETHAMINE 30 MG/ML IJ SOLN: 30.0000 mg | Freq: Four times a day (QID) | INTRAMUSCULAR | Status: DC | PRN

## 2015-02-24 MED ORDER — SENNOSIDES-DOCUSATE SODIUM 8.6-50 MG PO TABS
2.0000 | ORAL_TABLET | ORAL | Status: DC
  Administered 2015-02-25: 2 via ORAL
  Filled 2015-02-24 (×3): qty 2

## 2015-02-24 MED ORDER — NALBUPHINE HCL 10 MG/ML IJ SOLN
10.0000 mg | INTRAMUSCULAR | Status: AC
Start: 2015-02-24 — End: 2015-02-24
  Administered 2015-02-24: 10 mg via SUBCUTANEOUS
  Filled 2015-02-24: qty 1

## 2015-02-24 MED ORDER — MORPHINE SULFATE (PF) 0.5 MG/ML IJ SOLN
INTRAMUSCULAR | Status: AC
  Filled 2015-02-24: qty 10

## 2015-02-24 MED ORDER — ONDANSETRON HCL 4 MG/2ML IJ SOLN: 4.0000 mg | Freq: Three times a day (TID) | INTRAMUSCULAR | Status: DC | PRN

## 2015-02-24 MED ORDER — LACTATED RINGERS IV SOLN
40.0000 [IU] | INTRAVENOUS | Status: DC | PRN
  Administered 2015-02-24: 40 [IU] via INTRAVENOUS

## 2015-02-24 MED ORDER — LIDOCAINE HCL (PF) 1 % IJ SOLN: 30.0000 mL | INTRAMUSCULAR | Status: DC | PRN

## 2015-02-24 MED ORDER — ONDANSETRON HCL 4 MG/2ML IJ SOLN
INTRAMUSCULAR | Status: AC
  Filled 2015-02-24: qty 2

## 2015-02-24 MED ORDER — BUPIVACAINE HCL (PF) 0.25 % IJ SOLN
INTRAMUSCULAR | Status: DC | PRN
  Administered 2015-02-24: 20 mL

## 2015-02-24 MED ORDER — MEPERIDINE HCL 25 MG/ML IJ SOLN
INTRAMUSCULAR | Status: AC
  Filled 2015-02-24: qty 1

## 2015-02-24 MED ORDER — SODIUM CHLORIDE 0.9 % IJ SOLN: 3.0000 mL | Freq: Two times a day (BID) | INTRAMUSCULAR | Status: DC

## 2015-02-24 MED ORDER — SIMETHICONE 80 MG PO CHEW
80.0000 mg | CHEWABLE_TABLET | ORAL | Status: DC
  Filled 2015-02-24 (×2): qty 1

## 2015-02-24 MED ORDER — MEPERIDINE HCL 25 MG/ML IJ SOLN
INTRAMUSCULAR | Status: DC | PRN
  Administered 2015-02-24 (×2): 12.5 mg via INTRAVENOUS

## 2015-02-24 MED ORDER — TERBUTALINE SULFATE 1 MG/ML IJ SOLN: 0.2500 mg | Freq: Once | INTRAMUSCULAR | Status: DC | PRN

## 2015-02-24 MED ORDER — FENTANYL 2.5 MCG/ML BUPIVACAINE 1/10 % EPIDURAL INFUSION (WH - ANES)
14.0000 mL/h | INTRAMUSCULAR | Status: DC | PRN
  Administered 2015-02-24 (×2): 14 mL/h via EPIDURAL
  Filled 2015-02-24 (×2): qty 125

## 2015-02-24 MED ORDER — OXYTOCIN 10 UNIT/ML IJ SOLN
INTRAMUSCULAR | Status: AC
Start: 2015-02-24 — End: 2015-02-24
  Filled 2015-02-24: qty 4

## 2015-02-24 MED ORDER — FENTANYL CITRATE (PF) 100 MCG/2ML IJ SOLN: 25.0000 ug | INTRAMUSCULAR | Status: DC | PRN

## 2015-02-24 MED ORDER — LIDOCAINE-EPINEPHRINE (PF) 2 %-1:200000 IJ SOLN
INTRAMUSCULAR | Status: AC
  Filled 2015-02-24: qty 20

## 2015-02-24 MED ORDER — BUPIVACAINE LIPOSOME 1.3 % IJ SUSP
INTRAMUSCULAR | Status: DC | PRN
  Administered 2015-02-24: 20 mL

## 2015-02-24 MED ORDER — SODIUM CHLORIDE 0.9 % IV SOLN: 250.0000 mL | INTRAVENOUS | Status: DC | PRN

## 2015-02-24 MED ORDER — LACTATED RINGERS IV SOLN
500.0000 mL | INTRAVENOUS | Status: DC | PRN
  Administered 2015-02-24: 10:00:00 via INTRAVENOUS

## 2015-02-24 MED ORDER — SODIUM CHLORIDE 0.9 % IR SOLN
Status: DC | PRN
  Administered 2015-02-24: 1000 mL

## 2015-02-24 MED ORDER — EPHEDRINE 5 MG/ML INJ: 10.0000 mg | INTRAVENOUS | Status: DC | PRN

## 2015-02-24 MED ORDER — LIDOCAINE HCL (PF) 1 % IJ SOLN
INTRAMUSCULAR | Status: DC | PRN
Start: 2015-02-24 — End: 2015-02-24
  Administered 2015-02-24 (×2): 4 mL

## 2015-02-24 MED ORDER — ZOLPIDEM TARTRATE 5 MG PO TABS: 5.0000 mg | ORAL_TABLET | Freq: Every evening | ORAL | Status: DC | PRN

## 2015-02-24 MED ORDER — LACTATED RINGERS IV SOLN
INTRAVENOUS | Status: DC | PRN
  Administered 2015-02-24 (×2): via INTRAVENOUS

## 2015-02-24 MED ORDER — TETANUS-DIPHTH-ACELL PERTUSSIS 5-2.5-18.5 LF-MCG/0.5 IM SUSP
0.5000 mL | Freq: Once | INTRAMUSCULAR | Status: DC
  Filled 2015-02-24: qty 0.5

## 2015-02-24 MED ORDER — DIBUCAINE 1 % RE OINT
1.0000 "application " | TOPICAL_OINTMENT | RECTAL | Status: DC | PRN
  Filled 2015-02-24: qty 28

## 2015-02-24 MED ORDER — PENICILLIN G POTASSIUM 5000000 UNITS IJ SOLR
2.5000 10*6.[IU] | INTRAVENOUS | Status: DC
  Administered 2015-02-24 (×2): 2.5 10*6.[IU] via INTRAVENOUS
  Filled 2015-02-24 (×6): qty 2.5

## 2015-02-24 MED ORDER — NALOXONE HCL 2 MG/2ML IJ SOSY
1.0000 ug/kg/h | PREFILLED_SYRINGE | INTRAVENOUS | Status: DC | PRN
  Filled 2015-02-24: qty 2

## 2015-02-24 MED ORDER — DEXTROSE 5 % IV SOLN
5.0000 10*6.[IU] | Freq: Once | INTRAVENOUS | Status: AC
  Administered 2015-02-24: 5 10*6.[IU] via INTRAVENOUS
  Filled 2015-02-24: qty 5

## 2015-02-24 MED ORDER — PHENYLEPHRINE 40 MCG/ML (10ML) SYRINGE FOR IV PUSH (FOR BLOOD PRESSURE SUPPORT)
80.0000 ug | PREFILLED_SYRINGE | INTRAVENOUS | Status: DC | PRN
  Filled 2015-02-24: qty 20

## 2015-02-24 MED ORDER — METHYLERGONOVINE MALEATE 0.2 MG PO TABS: 0.2000 mg | ORAL_TABLET | ORAL | Status: DC | PRN

## 2015-02-24 MED ORDER — LACTATED RINGERS IV SOLN: INTRAVENOUS | Status: DC

## 2015-02-24 NOTE — Progress Notes (Signed)
S:  comfortable with epidural  O:  VS: Blood pressure 115/68, pulse 89, temperature 97.9 F (36.6 C), temperature source Oral, resp. rate 20, height 5\' 5"  (1.651 m), weight 99.791 kg (220 lb), SpO2 100 %.        FHR : baseline 135 / variability moderate / accelerations + / no decelerations        Toco: contractions every 2-4 minutes / pitocin 534mu/min         cervix : 7cm / 90% / vtx 0 station        membranes: clear fluid        foley placed        IUPC placed anterior  A: active labor     FHR category 1  P:  titrate pitocin to maintain adequate MVU      recheck in next 2-3 hours or sooner with status change      plan MD standby for possible shoulders  Marlinda MikeBAILEY, [place] CNM, MSN, [facility] SFACNM 02/24/2015, 12:21 PM

## 2015-02-24 NOTE — Progress Notes (Signed)
S:  No pain / feels fine  O:  VS: Blood pressure 113/66, pulse 78, temperature 98.4 F (36.9 C), temperature source Oral, resp. rate 20, height 5\' 5"  (1.651 m), weight 99.791 kg (220 lb), SpO2 100 %.        FHR : baseline 135 / variability moderate / accelerations + / variable decelerations        Toco: contractions every 2-6 minutes / coupling / MVU 110-165                   Pitocin only to 10mu since start this am and off due to FHR decels        cervix : no cervical change        membranes: clear  A: active labor with ineffective ctx pattern     FHR category 2  P: FHR with few variables no evidence of uteroplacental insufficiency / no late decels     increase pitocin Q 30 minutes until MVU 250-300     peanut positioning     recheck when ctx adequate     Marlinda MikeBAILEY, Danni Shima CNM, MSN, [facility]ACNM 02/24/2015, 4:25 PM

## 2015-02-24 NOTE — Consult Note (Signed)
Neonatology Note:   Attendance at C-section:    I was asked by Dr. Taavon to attend this C/S at term for failure to progress after IOL. The mother is a G3P0020, GBS positive (with aIAP) and otherwise reassuring labs. No fever or chorio concerns. 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 ROM ~14 hours delivery, fluid clear. Infant vigorous with good spontaneous cry and tone. Needed only minimal bulb suctioning. Ap 8 and 9. Lungs clear to ausc in DR. To CN to care of Pediatrician.  Support lactation.    David Ehrmann, MD 

## 2015-02-24 NOTE — Progress Notes (Signed)
S:  Understand plan of care for induction  O:  VS: Blood pressure 125/82, pulse 88, temperature 97.7 F (36.5 C), temperature source Oral, resp. rate 18, height 5\' 5"  (1.651 m).        FHR : baseline 140 / variability moderate / accelerations + / no decelerations        Toco: UI        cervix : loose 1cm         membranes: intact        cervical balloon placed without difficulty bimanual technique - inflated with 60ml - traction        cytotec 25mcg posterior fornix  A: induction labor     FHR category 1  P: traction to balloon Q 2 hours until out - AROM when out     Nubain 10 subQ for rest     strict I&O   Marlinda MikeBAILEY, Felton Buczynski CNM, MSN, FACNM 02/24/2015, 2:08 AM

## 2015-02-24 NOTE — Progress Notes (Addendum)
S:  Pain right side and cramping  / no pressure or pain  O:  VS: Blood pressure 122/67, pulse 81, temperature 98.9 F (37.2 C), temperature source Oral, resp. rate 20, height 5\' 5"  (1.651 m), weight 99.791 kg (220 lb), SpO2 100 %.        FHR : baseline 130 / variability moderate / accelerations + / occasional variable decelerations        Toco: contractions every 2-4 minutes / pitocin 12 mu/min / MVU 174-221        Cervix : swollen and edematous down to 4cm / 40% / 0 station with small caput       A: Arrest of labor     FHR category 2  P: DC pitocin      Discussed with patient and spouse - arrest of labor due to fetal - pelvic size variance       Continuation of labor would not allow for vaginal birth and would increase bleeding risk      Reviewed CS - risk for infection, bleeding, damage to local organs - longer recovery - anticipatory guidance for operative experience and postpartum care     Dr Billy Coastaavon notified     Marlinda MikeBAILEY, TANYA CNM, MSN, [facility]NM 02/24/2015, 7:22 PM   Agree with above. Consent signed and witnessed.

## 2015-02-24 NOTE — Progress Notes (Signed)
S:  feeling ctx - strong but not painful  O:  VS: Blood pressure 128/81, pulse 78, temperature 97.5 F (36.4 C), temperature source Oral, resp. rate 18, height 5\' 5"  (1.651 m), weight 99.791 kg (220 lb).        FHR : baseline 125 / variability moderate / accelerations + / no decelerations        Toco: contractions every 3-4 minutes / moderate         cervical balloon        cervix : 5cm / 90% / vtx / -2        membranes: BBOW - AROM clear fluid        PIH labs normal - PLT 165  A: latent labor     FHR category 1  P: PCN for GBS prophylaxis      Active management of labor - recheck 2hrs and consider pitocin if no progression  Marlinda MikeBAILEY, TANYA CNM, MSN, [facility]NM 02/24/2015, 6:32 AM

## 2015-02-24 NOTE — Progress Notes (Signed)
S:  Ctx feel same - wanting epidural if they get any worse  O:  VS: Blood pressure 135/85, pulse 82, temperature 97.6 F (36.4 C), temperature source Oral, resp. rate 20, height 5\' 5"  (1.651 m), weight 99.791 kg (220 lb).        FHR : baseline 130 / variability moderate / accelerations + / no decelerations        Toco: contractions every 3-4 minutes / mild - moderate         Cervix : same 5cm / 90 / vtx -2        Membranes: clear fluid  A: latent labor     FHR category 1  P: pitocin augmentation     Epidural prn for pain     IUPC when foley placed after epidural   Marlinda MikeBAILEY, Zyionna Pesce CNM, MSN, [facility]ACNM 02/24/2015, 9:35 AM

## 2015-02-24 NOTE — Transfer of Care (Signed)
Immediate Anesthesia Transfer of Care Note  Patient: Ashley Chapman  Procedure(s) Performed: Procedure(s): CESAREAN SECTION (N/A)  Patient Location: PACU  Anesthesia Type:Epidural  Level of Consciousness: awake  Airway & Oxygen Therapy: Patient Spontanous Breathing  Post-op Assessment: Report given to RN and Post -op Vital signs reviewed and stable  Post vital signs: stable  Last Vitals:  Filed Vitals:   02/24/15 1900 02/24/15 1902  BP:  122/67  Pulse:  81  Temp:    Resp: 20     Complications: No apparent anesthesia complications

## 2015-02-24 NOTE — Anesthesia Procedure Notes (Addendum)
Epidural Patient location during procedure: OB  Staffing Anesthesiologist: Shaquia Berkley Performed by: anesthesiologist   Preanesthetic Checklist Completed: patient identified, site marked, surgical consent, pre-op evaluation, timeout performed, IV checked, risks and benefits discussed and monitors and equipment checked  Epidural Patient position: sitting Prep: site prepped and draped and DuraPrep Patient monitoring: continuous pulse ox and blood pressure Approach: midline Location: L3-L4 Injection technique: LOR air  Needle:  Needle type: Tuohy  Needle gauge: 17 G Needle length: 9 cm and 9 Needle insertion depth: 6 cm Catheter type: closed end flexible Catheter size: 19 Gauge Catheter at skin depth: 10 cm Test dose: negative  Assessment Events: blood not aspirated, injection not painful, no injection resistance, negative IV test and no paresthesia

## 2015-02-24 NOTE — Op Note (Signed)
Cesarean Section Procedure Note  Indications: failure to progress: arrest of dilation  Pre-operative Diagnosis: 40 week 1 day pregnancy.  Post-operative Diagnosis: same  Surgeon: Lenoard AdenAAVON,Camela Wich J   Assistants: Kathi LudwigBailey, CNM, [facility]ACNM  Anesthesia: Epidural anesthesia and Local anesthesia 0.25.% bupivacaine  ASA Class: 2  Procedure Details  The patient was seen in the Holding Room. The risks, benefits, complications, treatment options, and expected outcomes were discussed with the patient.  The patient concurred with the proposed plan, giving informed consent. The risks of anesthesia, infection, bleeding and possible injury to other organs discussed. Injury to bowel, bladder, or ureter with possible need for repair discussed. Possible need for transfusion with secondary risks of hepatitis or HIV acquisition discussed. Post operative complications to include but not limited to DVT, PE and Pneumonia noted. The site of surgery properly noted/marked. The patient was taken to Operating Room # 9, identified as Ashley Chapman and the procedure verified as C-Section Delivery. A Time Out was held and the above information confirmed.  After induction of anesthesia, the patient was draped and prepped in the usual sterile manner. A Pfannenstiel incision was made and carried down through the subcutaneous tissue to the fascia. Fascial incision was made and extended transversely using Mayo scissors. The fascia was separated from the underlying rectus tissue superiorly and inferiorly. The peritoneum was identified and entered. Peritoneal incision was extended longitudinally. The utero-vesical peritoneal reflection was incised transversely and the bladder flap was bluntly freed from the lower uterine segment. A low transverse uterine incision(Kerr hysterotomy) was made. Delivered from OA presentation was a  female with Apgar scores of 8 at one minute and 9 at five minutes. Bulb suctioning gently performed. Neonatal team  in attendance.After the umbilical cord was clamped and cut cord blood was obtained for evaluation. The placenta was removed intact and appeared normal. The uterus was curetted with a dry lap pack. Good hemostasis was noted.The uterine outline, tubes and ovaries appeared normal. The uterine incision was closed with running locked sutures of 0 Monocryl x 2 layers. Hemostasis was observed. Lavage was carried out until clear.The parietal peritoneum was closed with a running 2-0 Monocryl suture. The fascia was then reapproximated with running sutures of 0 Monocryl. The skin was reapproximated with 3-0 monocryl after [place] closure with 2-0 plain.  Instrument, sponge, and needle counts were correct prior the abdominal closure and at the conclusion of the case.   Findings: FLTM, fundal placenta  Estimated Blood Loss:  500         Drains: foley                 Specimens: placenta                 Complications:  None; patient tolerated the procedure well.         Disposition: PACU - hemodynamically stable.         Condition: stable  Attending Attestation: I performed the procedure.

## 2015-02-24 NOTE — H&P (Signed)
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 female presenting for induction of labor - excessive weight gain in pregnancy > 60 pounds with significant dependent edema and labile hypertension - elevated uric acid at 7.4 with normal liver function and no proteinuria but noted falling PLT (229 @ 28 weeks to 165 today) with hemoconcentration of Hgb/Hct.  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 - no outbreaks / excessive weight gain > 60 pounds with significant edema / labile hypertension without proteinuria (highest 140/86) and noted no second trimester physiologic BP drop / rising uric acid to 7.4 with normal LE but falling PLT from 229 to 165 in third trimester - concern for developing pre-eclampsia & risk for HELLP / LGA with normal glucose metabolism ( GTT 129 and FBS all less than 90) / positive GBS  Prenatal Labs: ABO, Rh: O/Negative/-- (05/16 0000) / Rhogam given antepartum Antibody: Negative (05/16 0000) Rubella: Immune (05/16 0000)  RPR: Nonreactive (05/16 0000)  HBsAg: Negative (05/16 0000)  HIV: Non-reactive (05/16 0000)  GTT: normal  (monitoring FBS due to LGA with normal results) GBS: Positive (11/10 0000)   SONO @ 39 weeks: cephalic /  EFW 9-7 @ 4200gm / AFI 20 / posterior placenta  Medical / Surgical History :  Past medical history:  HSV history 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 Tonsillectomy     Family History:  Family History  Problem Relation Age of Onset  . Cancer Mother     colon  . Endometriosis Mother     Social History:  reports that she has never smoked. She has never used smokeless tobacco. She reports that she does not drink alcohol or use illicit drugs.  Allergies: Review of patient's allergies indicates no known allergies.   Current Medications at time of admission:  Prior to Admission medications   Medication Sig Start Date End Date Taking? Authorizing Provider         Multiple Vitamin (MULTIVITAMIN WITH MINERALS) TABS tablet Take 1 tablet by mouth daily.    Historical Provider, MD   Review of Systems: Active FM Rare cramping / ctx No PIH signs except persistent dependent edema  Physical Exam:  VS: Blood pressure 125/82, pulse 88, temperature 97.7 F (36.5 C), temperature source Oral, resp. rate 18.  General: alert and oriented, appears comfortable / NAD or pain Heart: RRR Lungs: Clear lung fields Abdomen: Gravid, soft and non-tender, non-distended / uterus: gravid with LGA fetus Extremities: 2+ dependent edema  Genitalia / VE:  posterior cervix / loose 1cm / 70% / vtx / -2  FHR: baseline rate 130 / variability moderate / accelerations + / no decelerations TOCO: UI  Assessment: 40.[redacted] weeks gestation Induction of labor - LGA / labile gestational hypertension with developing  PEC with risk HELLP FHR category 1 + GBS  Plan:  Admit Two stage IOL with placement of cytotec and cervical balloon  AROM when favorable with active management  - monitor closely for fetal descent GBS onset 0800 Saline lock until pitocin  Limit IVF and strict I&O PEC labs on admission  Marlinda Mike CNM, MSN, [facility] 02/24/2015, 1:18 AM

## 2015-02-24 NOTE — Anesthesia Preprocedure Evaluation (Signed)
Anesthesia Evaluation  Patient identified by MRN, date of birth, ID band Patient awake    Reviewed: Allergy & Precautions, NPO status , Patient's Chart, lab work & pertinent test results  Airway Mallampati: II  TM Distance: >3 FB Neck ROM: Full    Dental no notable dental hx. (+) Dental Advisory Given   Pulmonary neg pulmonary ROS,    Pulmonary exam normal breath sounds clear to auscultation       Cardiovascular negative cardio ROS Normal cardiovascular exam Rhythm:Regular Rate:Normal     Neuro/Psych negative neurological ROS  negative psych ROS   GI/Hepatic negative GI ROS, Neg liver ROS,   Endo/Other  negative endocrine ROSobesity  Renal/GU negative Renal ROS  negative genitourinary   Musculoskeletal negative musculoskeletal ROS (+)   Abdominal   Peds negative pediatric ROS (+)  Hematology negative hematology ROS (+)   Anesthesia Other Findings   Reproductive/Obstetrics (+) Pregnancy                             Anesthesia Physical Anesthesia Plan  ASA: II  Anesthesia Plan: Epidural   Post-op Pain Management:    Induction:   Airway Management Planned:   Additional Equipment:   Intra-op Plan:   Post-operative Plan:   Informed Consent: I have reviewed the patients History and Physical, chart, labs and discussed the procedure including the risks, benefits and alternatives for the proposed anesthesia with the patient or authorized representative who has indicated his/her understanding and acceptance.   Dental advisory given  Plan Discussed with: CRNA  Anesthesia Plan Comments:         Anesthesia Quick Evaluation

## 2015-02-25 ENCOUNTER — Encounter (HOSPITAL_COMMUNITY)
Admission: RE | Disposition: A | Discharge status: Home / Self Care | Payer: Self-pay | Source: Ambulatory Visit | Attending: Obstetrics

## 2015-02-25 LAB — CBC
HEMATOCRIT: 35.9 % — AB (ref 36.0–46.0)
Hemoglobin: 12.3 g/dL (ref 12.0–15.0)
MCH: 32.1 pg (ref 26.0–34.0)
MCHC: 34.3 g/dL (ref 30.0–36.0)
MCV: 93.7 fL (ref 78.0–100.0)
PLATELETS: 154 10*3/uL (ref 150–400)
RBC: 3.83 MIL/uL — ABNORMAL LOW (ref 3.87–5.11)
RDW: 14.1 % (ref 11.5–15.5)
WBC: 17.9 10*3/uL — ABNORMAL HIGH (ref 4.0–10.5)

## 2015-02-25 SURGERY — Surgical Case
Anesthesia: Epidural | Site: Abdomen

## 2015-02-25 MED ORDER — HYDROCHLOROTHIAZIDE 12.5 MG PO CAPS
12.5000 mg | ORAL_CAPSULE | Freq: Every day | ORAL | Status: DC
  Administered 2015-02-25 – 2015-02-26 (×2): 12.5 mg via ORAL
  Filled 2015-02-25 (×2): qty 1

## 2015-02-25 MED ORDER — RHO D IMMUNE GLOBULIN 1500 UNIT/2ML IJ SOSY
300.0000 ug | PREFILLED_SYRINGE | Freq: Once | INTRAMUSCULAR | Status: AC
  Administered 2015-02-25: 300 ug via INTRAVENOUS
  Filled 2015-02-25: qty 2

## 2015-02-25 SURGICAL SUPPLY — 37 items
CLAMP CORD UMBIL (MISCELLANEOUS) IMPLANT
CLOTH BEACON ORANGE TIMEOUT ST (SAFETY) ×2 IMPLANT
CONTAINER PREFILL 10% NBF 15ML (MISCELLANEOUS) IMPLANT
DERMABOND ADHESIVE PROPEN (GAUZE/BANDAGES/DRESSINGS) ×1
DERMABOND ADVANCED .7 DNX6 (GAUZE/BANDAGES/DRESSINGS) ×1 IMPLANT
DRAPE SHEET LG 3/4 BI-LAMINATE (DRAPES) IMPLANT
DRSG OPSITE POSTOP 4X10 (GAUZE/BANDAGES/DRESSINGS) ×2 IMPLANT
DURAPREP 26ML APPLICATOR (WOUND CARE) ×2 IMPLANT
ELECT REM PT RETURN 9FT ADLT (ELECTROSURGICAL) ×2
ELECTRODE REM PT RTRN 9FT ADLT (ELECTROSURGICAL) ×1 IMPLANT
EXTRACTOR VACUUM M CUP 4 TUBE (SUCTIONS) IMPLANT
GLOVE BIO SURGEON STRL SZ7.5 (GLOVE) ×2 IMPLANT
GLOVE BIOGEL PI IND STRL 7.0 (GLOVE) ×1 IMPLANT
GLOVE BIOGEL PI INDICATOR 7.0 (GLOVE) ×1
GOWN STRL REUS W/TWL LRG LVL3 (GOWN DISPOSABLE) ×4 IMPLANT
KIT ABG SYR 3ML LUER SLIP (SYRINGE) IMPLANT
NEEDLE HYPO 22GX1.5 SAFETY (NEEDLE) ×2 IMPLANT
NEEDLE HYPO 25X5/8 SAFETYGLIDE (NEEDLE) IMPLANT
NEEDLE SPNL 20GX3.5 QUINCKE YW (NEEDLE) IMPLANT
NS IRRIG 1000ML POUR BTL (IV SOLUTION) ×2 IMPLANT
PACK C SECTION WH (CUSTOM PROCEDURE TRAY) ×2 IMPLANT
PAD ABD 7.5X8 STRL (GAUZE/BANDAGES/DRESSINGS) ×2 IMPLANT
PENCIL SMOKE EVAC W/HOLSTER (ELECTROSURGICAL) ×2 IMPLANT
SPONGE GAUZE 4X4 12PLY (GAUZE/BANDAGES/DRESSINGS) ×2 IMPLANT
SUT MNCRL 0 VIOLET CTX 36 (SUTURE) ×2 IMPLANT
SUT MNCRL AB 3-0 PS2 27 (SUTURE) ×2 IMPLANT
SUT MON AB 2-0 CT1 27 (SUTURE) ×2 IMPLANT
SUT MON AB-0 CT1 36 (SUTURE) ×6 IMPLANT
SUT MONOCRYL 0 CTX 36 (SUTURE) ×2
SUT PLAIN 0 NONE (SUTURE) IMPLANT
SUT PLAIN 2 0 (SUTURE)
SUT PLAIN 2 0 XLH (SUTURE) IMPLANT
SUT PLAIN ABS 2-0 CT1 27XMFL (SUTURE) IMPLANT
SYR 20CC LL (SYRINGE) IMPLANT
SYR CONTROL 10ML LL (SYRINGE) ×2 IMPLANT
TOWEL OR 17X24 6PK STRL BLUE (TOWEL DISPOSABLE) ×2 IMPLANT
TRAY FOLEY CATH SILVER 14FR (SET/KITS/TRAYS/PACK) ×2 IMPLANT

## 2015-02-25 NOTE — Lactation Note (Signed)
This note was copied from the chart of Boy Jacky KindleMargaret Johnson. Lactation Consultation Note  Patient Name: Boy Jacky KindleMargaret Johnson BJYNW'GToday's Date: 02/25/2015 Reason for consult: Initial assessment;Other (Comment) (baby in the nusery for a circ , mom aware to call for feeding assessment )  Baby 16 hours old and is in the nursery for a circ.  Mother informed of post-discharge support and given phone number to the lactation department, including services for phone call assistance; out-patient appointments; and breastfeeding support group. List of other breastfeeding resources in the community given in the handout. Encouraged mother to call for problems or concerns related to breastfeeding.   Maternal Data Has patient been taught Hand Expression?:  (unable )  Feeding Feeding Type:  (per mom the baby ate well this am )  LATCH Score/Interventions                Intervention(s): Breastfeeding basics reviewed     Lactation Tools Discussed/Used     Consult Status Consult Status: Follow-up Date: 02/25/15 Follow-up type: In-patient    Kathrin Greathouseorio, Maisen Ann 02/25/2015, 12:46 PM

## 2015-02-25 NOTE — Lactation Note (Signed)
This note was copied from the chart of Boy Jacky KindleMargaret Johnson. Lactation Consultation Note  Patient Name: Boy Jacky KindleMargaret Johnson ZOXWR'UToday's Date: 02/25/2015 Reason for consult: Follow-up assessment [facility] Assisted Mom to obtain more depth with latch. Basic teaching reviewed with Mom, encouraged to continue to BF with feeding ques, cluster feeding discussed. Mom reports some mild tenderness, no trauma observed. Discussed importance of deep latch and to apply EBM. Encouraged to call for assist as needed.   Maternal Data    Feeding Feeding Type: Breast Fed Length of feed: 10 min  LATCH Score/Interventions Latch: Grasps breast easily, tongue down, lips flanged, rhythmical sucking. Intervention(s): Adjust position;Assist with latch;Breast massage;Breast compression  Audible Swallowing: A few with stimulation  Type of Nipple: Everted at rest and after stimulation  Comfort (Breast/Nipple): Soft / non-tender     Hold (Positioning): Assistance needed to correctly position infant at breast and maintain latch.  LATCH Score: 8  Lactation Tools Discussed/Used     Consult Status Consult Status: Follow-up Date: 02/26/15 Follow-up type: In-patient    Alfred LevinsGranger, Dejane Scheibe Ann 02/25/2015, 5:52 PM

## 2015-02-25 NOTE — Progress Notes (Signed)
POSTOPERATIVE DAY # 1 S/P CS   S:         Reports feeling well - no pain or concerns             Tolerating po intake / no nausea / no vomiting / no flatus / no BM             Bleeding is light             Pain controlled with motrin and percocet             Up ad lib / ambulatory/ voiding QS  Newborn breast feeding   O:  VS: BP 112/59 mmHg  Pulse 98  Temp(Src) 98.8 F (37.1 C) (Oral)  Resp 20  Ht 5\' 5"  (1.651 m)  Wt 99.791 kg (220 lb)  BMI 36.61 kg/m2  SpO2 98%  Breastfeeding? Unknown   LABS:               Recent Labs  02/24/15 2130 02/25/15 0555  WBC 18.0* 17.9*  HGB 13.2 12.3  PLT 140* 154               Bloodtype: --/--/O NEG (12/09 0140)  Rubella: Immune (05/16 0000)                Tdap and Flu current                                       I&O: negative total: 4322ml             Physical Exam:             Alert and Oriented X3  Lungs: Clear and unlabored  Heart: regular rate and rhythm / no mumurs  Abdomen: soft, non-tender, slightly distended, active BS             Fundus: firm, non-tender, Ueven             Dressing intact pressure dressing              Lochia: small  Extremities: 2+ pedal edema, no calf pain or tenderness, SCD in place  A:        POD # 1 S/P CS            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

## 2015-02-25 NOTE — Anesthesia Postprocedure Evaluation (Signed)
Anesthesia Post Note  Patient: Ashley KindleMargaret Chapman  Procedure(s) Performed: Procedure(s) (LRB): CESAREAN SECTION (N/A)  Patient location during evaluation: PACU Anesthesia Type: Epidural Level of consciousness: awake and alert Pain management: pain level controlled Vital Signs Assessment: post-procedure vital signs reviewed and stable Respiratory status: spontaneous breathing, nonlabored ventilation and respiratory function stable Cardiovascular status: stable Postop Assessment: no headache, no backache and epidural receding Anesthetic complications: no    Last Vitals:  Filed Vitals:   02/25/15 0534 02/25/15 0535  BP: 109/64 112/59  Pulse: 100 98  Temp:    Resp: 20 20    Last Pain:  Filed Vitals:   02/25/15 [id number]  PainSc: 0-No pain                 Phillips Groutarignan, Aiyannah Fayad

## 2015-02-26 LAB — RH IG WORKUP (INCLUDES ABO/RH)
ABO/RH(D): O NEG
Fetal Screen: NEGATIVE
Gestational Age(Wks): 40
UNIT DIVISION: 0

## 2015-02-26 MED ORDER — FOLIC ACID 1 MG PO TABS: 1.0000 mg | ORAL_TABLET | Freq: Every day | ORAL | Status: DC

## 2015-02-26 MED ORDER — IBUPROFEN 600 MG PO TABS: 600.0000 mg | ORAL_TABLET | Freq: Four times a day (QID) | ORAL | Status: DC

## 2015-02-26 MED ORDER — HYDROCHLOROTHIAZIDE 12.5 MG PO CAPS: 12.5000 mg | ORAL_CAPSULE | Freq: Every day | ORAL | Status: DC

## 2015-02-26 MED ORDER — OXYCODONE-ACETAMINOPHEN 5-325 MG PO TABS: 1.0000 | ORAL_TABLET | ORAL | Status: DC | PRN

## 2015-02-26 NOTE — Discharge Summary (Signed)
POSTOPERATIVE DISCHARGE SUMMARY:  Patient ID: Ashley KindleMargaret Chapman MRN: [id number] DOB/AGE: [birth year] 31 y.o.  Admit date: 02/24/2015 Admission Diagnoses: 40 weeks / labile hypertension with elevated uric acid and dependent edema / MTFHR carrier / LGA / excessive weight gain in pregnancy (<60lb)  Discharge date:  02/26/2015 Discharge Diagnoses: POD 2 s/p primary cesarean section for arrest of active labor both dilation and descent  Prenatal history: [id number]   EDC : 02/23/2015, by Other Basis  [facility]  Primary provider : Kathi LudwigBailey CNM Prenatal course complicated by pregnancy loss - recurrent / MTFHR carrier ( variant A and C heterozygous) / HSV hx / weight gain > 60pound in preganncy / LGA / labile HTN and elevated uric acid with low platelet - risk for preeclampsia  Prenatal Labs: ABO, Rh: --/--/O NEG (12/10 0555) / Rhophylac given POSTPARTUM day 1 Antibody: POS (12/09 0140) from rhogam at first visit due to recent SAB - titre un-measureable with follow-up screens negative Rubella: Immune (05/16 0000)   RPR: Non Reactive (12/09 0140)  HBsAg: Negative (05/16 0000)  HIV: Non-reactive (05/16 0000)  GBS: Positive (11/10 0000)   Medical / Surgical History :  Past medical history:  Past Medical History  Diagnosis Date  . HSV (herpes simplex virus) anogenital infection   . Methylenetetrahydrofolate reductase (MTHFR) gene mutation [facility](HCC)     Past surgical history:  Past Surgical History  Procedure Laterality Date  . Tonsillectomy      Family History:  Family History  Problem Relation Age of Onset  . Cancer Mother     colon  . Endometriosis Mother     Social History:  reports that she has never smoked. She has never used smokeless tobacco. She reports that she does not drink alcohol or use illicit drugs.  Allergies: Review of patient's allergies indicates no known allergies.   Current Medications at time of admission:  Prior to Admission  medications   Medication Sig Start Date End Date Taking? Authorizing Provider  acetaminophen (TYLENOL) 500 MG tablet Take 1,000 mg by mouth every 6 (six) hours as needed for headache.   Yes Historical Provider, MD  cetirizine (ZYRTEC) 10 MG tablet Take 10 mg by mouth daily.   Yes Historical Provider, MD  Prenatal Vit-Fe Fumarate-FA (PRENATAL MULTIVITAMIN) TABS tablet Take 1 tablet by mouth daily at 12 noon.   Yes Historical Provider, MD  Probiotic Product (PROBIOTIC PO) Take 1 tablet by mouth daily.   Yes Historical Provider, MD  ranitidine (ZANTAC) 150 MG tablet Take 150 mg by mouth daily.   Yes Historical Provider, MD  valACYclovir (VALTREX) 1000 MG tablet Take 1,000 mg by mouth daily.   Yes Historical Provider, MD    Intrapartum Course:  Admit for induction of labor with labor progression to 7cm dilation with normal labor curve until active labor arrest with no progression past 0 station which produced edema and swelling of cervix down to 4cm dilation with pitocin augmentation Pain management: epidural Complicated by: arrest of labor / no development of pre-eclampsia Interventions required: primary cesarean section  Procedures: Cesarean section delivery on 02/24/2015 with delivery of female newborn by Dr Billy Coastaavon   See operative report for further details APGAR (1 MIN): 8   APGAR (5 MINS): 9    Birth weight 9-7  Postoperative / postpartum course:  Uncomplicated with discharge on POD 2  Discharge Instructions:  Discharged Condition: stable  Activity: pelvic rest and postoperative restrictions x 2   Diet: routine  Medications:  Medication List    STOP taking these medications        valACYclovir 1000 MG tablet  Commonly known as:  VALTREX      TAKE these medications        acetaminophen 500 MG tablet  Commonly known as:  TYLENOL  Take 1,000 mg by mouth every 6 (six) hours as needed for headache.     cetirizine 10 MG tablet  Commonly known as:  ZYRTEC  Take 10 mg by  mouth daily.     folic acid 1 MG tablet  Commonly known as:  FOLVITE  Take 1 tablet (1 mg total) by mouth daily.     hydrochlorothiazide 12.5 MG capsule  Commonly known as:  MICROZIDE  Take 1 capsule (12.5 mg total) by mouth daily.     ibuprofen 600 MG tablet  Commonly known as:  ADVIL,MOTRIN  Take 1 tablet (600 mg total) by mouth every 6 (six) hours.     oxyCODONE-acetaminophen 5-325 MG tablet  Commonly known as:  PERCOCET/ROXICET  Take 1 tablet by mouth every 4 (four) hours as needed (for pain scale 4-7).     prenatal multivitamin Tabs tablet  Take 1 tablet by mouth daily at 12 noon.     PROBIOTIC PO  Take 1 tablet by mouth daily.     ranitidine 150 MG tablet  Commonly known as:  ZANTAC  Take 150 mg by mouth daily.        Wound Care: keep clean and dry  Postpartum Instructions: [facility] discharge booklet - instructions reviewed  Discharge to: Home  Follow up :  [facility] in 3 days for interval visit with Fredric Mare CNM for BP recheck / weight check / incision check [facility] in 6 weeks for routine postpartum visit with Fredric Mare CNM                Signed: Marlinda Mike CNM, MSN, [facility] 02/26/2015, 11:09 AM

## 2015-02-26 NOTE — Progress Notes (Signed)
POSTOPERATIVE DAY # 2 S/P CS arrest of active labor   S:         Reports feeling well - desires to go home / no PIH symptoms             Tolerating po intake / no nausea / no vomiting / + flatus this AM so gas pain is better / no BM             Bleeding is light             Pain controlled with motrin only - ok percocet for pain             Up ad lib / ambulatory/ voiding QS  Newborn breast feeding   O:  VS: BP 111/61 mmHg  Pulse 78  Temp(Src) 98.3 F (36.8 C) (Oral)  Resp 18  Ht 5\' 5"  (1.651 m)  Wt 99.791 kg (220 lb)  BMI 36.61 kg/m2  SpO2 100%  Breastfeeding? Unknown   LABS:              Bloodtype: --/--/O NEG (12/10 0555) - Rhogam given  Rubella: Immune (05/16 0000)                                            I&O: +1420 today  (since admit negative 3179)             Physical Exam:             Alert and Oriented X3  Lungs: Clear and unlabored  Heart: regular rate and rhythm / no mumurs  Abdomen: soft, non-tender, non-distended, active BS             Fundus: firm, non-tender, Ueven             Dressing intact honeycomb              Incision:  approximated with suture / no erythema / no ecchymosis / no drainage  Perineum: intact  Lochia: light  Extremities: 1+ edema, no calf pain or tenderness, negative Homan  A:        POD # 2 S/P CS            MTFHR - heterozygous with two variants  P:        Routine postoperative care              DC home             Continue HCTZ - recheck BP and weight this week at [facility]             Restart baby ASA postop at home tomorrow and folic acid 1mg  for MTFHR   Ashley Chapman, Ashley Chapman CNM, MSN, [facility] 02/26/2015, 9:30 AM

## 2015-02-27 ENCOUNTER — Encounter (HOSPITAL_COMMUNITY): Payer: Self-pay | Admitting: Obstetrics and Gynecology

## 2015-02-27 NOTE — Addendum Note (Signed)
Addendum  created 02/27/15 1619 by Algis GreenhouseLinda A Caryville Grosser, CRNA   Modules edited: Anesthesia Events, Narrator   Narrator:  Narrator: Event Log Edited

## 2015-02-27 NOTE — Addendum Note (Signed)
Addendum  created 02/27/15 1622 by Algis GreenhouseLinda A Emmalise Huard, CRNA   Modules edited: Clinical Notes   Clinical Notes:  File: [id number]

## 2015-02-27 NOTE — Anesthesia Postprocedure Evaluation (Signed)
Anesthesia Post Note  Patient: Ashley Chapman  Procedure(s) Performed: Procedure(s) (LRB): CESAREAN SECTION (N/A)  Comments: Patient discharged to home before anesthesia post op. Chart review indicates no anesthetic complications.    Last Vitals:  Filed Vitals:   02/25/15 1809 02/26/15 0545  BP: 109/59 111/61  Pulse: 74 78  Temp: 36.7 C 36.8 C  Resp: 18 18    Last Pain:  Filed Vitals:   02/26/15 0930  PainSc: 0-No pain                 Ashley Chapman

## 2015-02-28 LAB — TYPE AND SCREEN
ABO/RH(D): O NEG
Antibody Screen: POSITIVE
DAT, IgG: NEGATIVE
Unit division: 0
Unit division: 0
Unit division: 0
Unit division: 0

## 2016-01-10 ENCOUNTER — Other Ambulatory Visit: Payer: Self-pay | Admitting: Certified Nurse Midwife

## 2016-06-05 ENCOUNTER — Encounter (INDEPENDENT_AMBULATORY_CARE_PROVIDER_SITE_OTHER): Payer: Self-pay | Admitting: Orthopedic Surgery

## 2016-06-05 ENCOUNTER — Ambulatory Visit (INDEPENDENT_AMBULATORY_CARE_PROVIDER_SITE_OTHER): Payer: BLUE CROSS/BLUE SHIELD | Admitting: Orthopedic Surgery

## 2016-06-05 ENCOUNTER — Ambulatory Visit (INDEPENDENT_AMBULATORY_CARE_PROVIDER_SITE_OTHER): Payer: BLUE CROSS/BLUE SHIELD

## 2016-06-05 DIAGNOSIS — M25562 Pain in left knee: Secondary | ICD-10-CM | POA: Diagnosis not present

## 2016-06-05 NOTE — Telephone Encounter (Signed)
I would go with ibuprofen a knee sleeve non-loadbearing exercises for fitness and a follow-up appointment in 6 weeks to make sure the swelling goes down okay to hold off on MRI but did need follow-up appointment please call thanks

## 2016-06-05 NOTE — Progress Notes (Signed)
Office Visit Note   Patient: Ashley Chapman           Date of Birth: [birth year]           MRN: [id number] Visit Date: 06/05/2016 Requested by: No referring provider defined for this encounter. PCP: Farris Has, MD  Subjective: Chief Complaint  Patient presents with  . Left Knee - Pain    HPI: Ashley Chapman is a 31 year old female with left knee pain.  She injured her knee working out 3 weeks ago doing a high step.  She felt a pop in the lateral aspect of her knee had immediate swelling.  She describes effusion with new popping with stairs.  She's used a knee brace and taking ibuprofen with marginal relief.  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  She has a 15-year-old home.  She does report giving way and popping and definite mechanical symptoms in the left knee which are new..              ROS: All systems reviewed are negative as they relate to the chief complaint within the history of present illness.  Patient denies  fevers or chills.   Assessment & Plan: Visit Diagnoses:  1. Acute pain of left knee     Plan: Impression left knee pain with effusion and lateral joint line tenderness stable collateral cruciate ligaments.  I think very high likelihood Ashley Chapman has a lateral meniscal tear or chondral damage in that left knee.  She has an effusion which is persisted for 3 weeks despite conservative care.  She's not completely incapacitated by the pain but she does have persistent mechanical symptoms.  MRI scan indicated at this time to evaluate for lateral meniscal tear and/or chondral damage from fairly high impact aerobic workout.  She has a discrete mechanism of injury with effusion in her knee.  Internal derangement is likely.  I'll see her back after that study  Follow-Up Instructions: Return for after MRI.   Orders:  Orders Placed This Encounter  Procedures  . XR KNEE 3 VIEW LEFT   No orders of the defined types were placed in this encounter.     Procedures: No  procedures performed   Clinical Data: No additional findings.  Objective: Vital Signs: There were no vitals taken for this visit.  Physical Exam:   Constitutional: Patient appears well-developed HEENT:  Head: Normocephalic Eyes:EOM are normal Neck: Normal range of motion Cardiovascular: Normal rate Pulmonary/chest: Effort normal Neurologic: Patient is alert Skin: Skin is warm Psychiatric: Patient has normal mood and affect    Ortho Exam: Examination of the left knee demonstrates full active and passive range of motion but with slight restriction of full flexion due to a moderate left knee effusion.  Extensor mechanism is intact.  Pedal pulses palpable.  No groin pain with internal/external rotation of the leg.  No other masses lymph adenopathy or skin changes noted in the left knee region.  She is stable to varus and valgus stress at 0 and 30.  I'll detect any asymmetric posterior lateral rotatory instability left versus right but that would need to be reassessed at next visit.  Her anterior cruciate ligament feels like it does have an endpoint.  There is lateral joint line tenderness.  Specialty Comments:  No specialty comments available.  Imaging: No results found.   PMFS History: Patient Active Problem List   Diagnosis Date Noted  . Postpartum care following cesarean delivery (12/9) 02/25/2015  . Gestational hypertension 02/24/2015  .  Large for gestational age fetus affecting management of mother, antepartum 02/24/2015  . Methylenetetrahydrofolate reductase (MTHFR) gene mutation [facility](HCC)    Past Medical History:  Diagnosis Date  . HSV (herpes simplex virus) anogenital infection   . Methylenetetrahydrofolate reductase (MTHFR) gene mutation (HCC)     Family History  Problem Relation Age of Onset  . Cancer Mother     colon  . Endometriosis Mother     Past Surgical History:  Procedure Laterality Date  . CESAREAN SECTION N/A 02/25/2015   Procedure: CESAREAN SECTION;   Surgeon: Olivia Mackieichard Taavon, MD;  Location: [facility];  Service: Obstetrics;  Laterality: N/A;  . TONSILLECTOMY     Social History   Occupational History  . Not on file.   Social History Main Topics  . Smoking status: Never Smoker  . Smokeless tobacco: Never Used  . Alcohol use No  . Drug use: No  . Sexual activity: Not on file

## 2016-06-13 ENCOUNTER — Other Ambulatory Visit: Payer: BLUE CROSS/BLUE SHIELD

## 2017-03-18 NOTE — L&D Delivery Note (Signed)
Delivery Note Quick transition to complete dilation, no epidural available d/t unit acuity.  At 12:33 AM a viable female was delivered via Vaginal, Spontaneous (Presentation: vertex; OA to ROT ).  APGAR: 9, 9; weight pending .   Placenta status: Para MarchDuncan, S/C/I.  Cord:  with the following complications: short.  Cord blood collected for typing. Placenta for disposal in unit.  Anesthesia:  local Episiotomy: None Lacerations: 2nd degree;Perineal Suture Repair: 2.0 3.0 vicryl Est. Blood Loss (mL): 200  Mom to postpartum.  Baby 'Ashley Chapman" to Couplet care / Skin to Skin.  Neta MendsDaniela C Paul, CNM 10/17/2017, 1:03 AM

## 2017-04-15 LAB — OB RESULTS CONSOLE GC/CHLAMYDIA
CHLAMYDIA, DNA PROBE: NEGATIVE
GC PROBE AMP, GENITAL: NEGATIVE

## 2017-04-15 LAB — OB RESULTS CONSOLE HEPATITIS B SURFACE ANTIGEN: HEP B S AG: NEGATIVE

## 2017-04-15 LAB — OB RESULTS CONSOLE ABO/RH: RH Type: NEGATIVE

## 2017-04-15 LAB — OB RESULTS CONSOLE RUBELLA ANTIBODY, IGM: Rubella: IMMUNE

## 2017-04-15 LAB — OB RESULTS CONSOLE RPR: RPR: NONREACTIVE

## 2017-04-15 LAB — OB RESULTS CONSOLE HIV ANTIBODY (ROUTINE TESTING): HIV: NONREACTIVE

## 2017-09-23 LAB — OB RESULTS CONSOLE GBS: STREP GROUP B AG: NEGATIVE

## 2017-10-12 ENCOUNTER — Inpatient Hospital Stay (HOSPITAL_COMMUNITY)
Admission: AD | Admit: 2017-10-12 | Discharge: 2017-10-12 | Disposition: A | Discharge status: Home / Self Care | Payer: BLUE CROSS/BLUE SHIELD | Source: Ambulatory Visit | Attending: Obstetrics and Gynecology | Admitting: Obstetrics and Gynecology

## 2017-10-12 ENCOUNTER — Encounter: Payer: Self-pay | Admitting: Student

## 2017-10-12 DIAGNOSIS — Z800 Family history of malignant neoplasm of digestive organs: Secondary | ICD-10-CM | POA: Insufficient documentation

## 2017-10-12 DIAGNOSIS — O34219 Maternal care for unspecified type scar from previous cesarean delivery: Secondary | ICD-10-CM | POA: Insufficient documentation

## 2017-10-12 DIAGNOSIS — Z3A39 39 weeks gestation of pregnancy: Secondary | ICD-10-CM | POA: Diagnosis not present

## 2017-10-12 DIAGNOSIS — Z842 Family history of other diseases of the genitourinary system: Secondary | ICD-10-CM | POA: Diagnosis not present

## 2017-10-12 NOTE — MAU Provider Note (Signed)
  History     CSN: [id number]  Arrival date and time: 10/12/17 0944   Chief Complaint  Patient presents with  . Scheduled Induction    cervical balloon for ripening   HPI  Ashley Chapman is a [id number] at 314w1d here for scheduled cervical balloon placement for ripening.  Desires TOLAC. R/B of procedure reviewed in office.  Feels well, no ctx, no LOF/VB.  [facility] primary provider Wiliam Ke. Bailey, CNM. Prenatal course complicated by Hx C/S for failure to progress, hx HSV/no active lesions, on suppressive therapy, hx PEC, stopped bASA at 36 wks, and MTHFR mutation on methylfolate.   Past Medical History:  Diagnosis Date  . HSV (herpes simplex virus) anogenital infection   . Methylenetetrahydrofolate reductase (MTHFR) gene mutation [facility](HCC)     Past Surgical History:  Procedure Laterality Date  . CESAREAN SECTION N/A 02/25/2015   Procedure: CESAREAN SECTION;  Surgeon: Olivia Mackieichard Taavon, MD;  Location: [facility];  Service: Obstetrics;  Laterality: N/A;  . TONSILLECTOMY      Family History  Problem Relation Age of Onset  . Cancer Mother        colon  . Endometriosis Mother     Social History   Tobacco Use  . Smoking status: Never Smoker  . Smokeless tobacco: Never Used  Substance Use Topics  . Alcohol use: No  . Drug use: No    Allergies: No Known Allergies  Medications Prior to Admission  Medication Sig Dispense Refill Last Dose  . acetaminophen (TYLENOL) 500 MG tablet Take 1,000 mg by mouth every 6 (six) hours as needed for headache.   Taking  . cetirizine (ZYRTEC) 10 MG tablet Take 10 mg by mouth daily.   Taking  . folic acid (FOLVITE) 1 MG tablet Take 1 tablet (1 mg total) by mouth daily. 90 tablet 1 Taking  . ibuprofen (ADVIL,MOTRIN) 600 MG tablet Take 1 tablet (600 mg total) by mouth every 6 (six) hours. 30 tablet 0 Taking  . Prenatal Vit-Fe Fumarate-FA (PRENATAL MULTIVITAMIN) TABS tablet Take 1 tablet by mouth daily at 12 noon.   Taking  . Probiotic Product  (PROBIOTIC PO) Take 1 tablet by mouth daily.   Taking   Valtrex 1 gm daily   Review of Systems Physical Exam   Blood pressure 117/76, pulse 76, temperature 97.8 F (36.6 C), temperature source Oral, resp. rate 18, SpO2 100 %, currently not breastfeeding.  Physical Exam   Gen: AAO x 3, NAD CV: RRR Pulm: CTAB Abd: gravid, NT, S=D, vertex by Leopold's, EFW 7.5-8 lbs GU: no lesions, SVE 1-2/thick/-1 Cervical balloon placed w/ ease, inflated w 60 cc NS, traction to leg.  Patient tolerated well.  Ext: no edema, neg clonus  FHT 125, mod var, + accels, no decels Toco no ctx  MAU Course  Procedures  Cervical balloon NST  Assessment and Plan  G4P1021 at 834w1d, previous c/s for TOLAC / IOL Unfavorable cvx FHT cat 1 GBS neg  Cervical balloon placed w/ instructions for active labor return F/U in office tomorrow AM if no interim labor.   Dr. Amado NashAlmquist updated w/ POC  Neta MendsDaniela C Santiel Topper 10/12/2017, 10:04 AM

## 2017-10-12 NOTE — Discharge Instructions (Signed)
Trial of Labor After Cesarean Delivery A trial of labor after cesarean delivery (TOLAC) is when a woman tries to give birth vaginally after a previous cesarean delivery. TOLAC may be a safe and appropriate option for you depending on your medical history and other risk factors. When TOLAC is successful and you are able to have a vaginal delivery, this is called a vaginal birth after cesarean delivery (VBAC). Candidates for TOLAC TOLAC is possible for some women who:  Have undergone one or two prior cesarean deliveries in which the incision of the uterus was horizontal (low transverse).  Are carrying twins and have had one prior low transverse incision during a cesarean delivery.  Do not have a vertical (classical) uterine scar.  Have not had a tear in the wall of their uterus (uterine rupture).  TOLAC is also supported for women who meet appropriate criteria and:  Are under the age of 40 years.  Are tall and have a body mass index (BMI) of less than 30.  Have an unknown uterine scar.  Give birth in a facility equipped to handle an emergency cesarean delivery. This team should be able to handle possible complications such as a uterine rupture.  Have thorough counseling about the benefits and risks of TOLAC.  Have discussed future pregnancy plans with their health care provider.  Plan to have several more pregnancies.  Most successful candidates for TOLAC:  Have had a successful vaginal delivery before or after their cesarean delivery.  Experience labor that begins naturally on or before the due date (40 weeks of gestation).  Do not have a very large (macrosomic) baby.  Had a prior cesarean delivery but are not currently experiencing factors that would prompt a cesarean delivery (such as a breech position).  Had only one prior cesarean delivery.  Had a prior cesarean delivery that was performed early in labor and not after full cervical dilation. TOLAC may be most appropriate  for women who meet the above guidelines and who plan to have more pregnancies. TOLAC is not recommended for home births. Least successful candidates for TOLAC:  Have an induced labor with an unfavorable cervix. An unfavorable cervix is when the cervix is not dilating enough (among other factors).  Have never had a vaginal delivery.  Have had more than two cesarean deliveries.  Have a pregnancy at more than 40 weeks of gestation.  Are pregnant with a baby with a suspected weight greater than 4,000 grams (8 pounds) and who have no prior history of a vaginal delivery.  Have closely spaced pregnancies. Suggested benefits of TOLAC  You may have a faster recovery time.  You may have a shorter stay in the hospital.  You may have less pain and fewer problems than with a cesarean delivery. Women who have a cesarean delivery have a higher chance of needing blood or getting a fever, an infection, or a blood clot in the legs. Suggested risks of TOLAC The highest risk of complications happens to women who attempt a TOLAC and fail. A failed TOLAC results in an unplanned cesarean delivery. Risks related to TOLAC or repeat cesarean deliveries include:  Blood loss.  Infection.  Blood clot.  Injury to surrounding tissues or organs.  Having to remove the uterus (hysterectomy).  Potential problems with the placenta (such as placenta previa or placenta accreta) in future pregnancies.  Although very rare, the main concerns with TOLAC are:  Rupture of the uterine scar from a past cesarean delivery.  Needing an emergency   cesarean delivery.  Having a bad outcome for the baby (perinatal morbidity).  Where to find more information:  American Congress of Obstetricians and Gynecologists: www.acog.org  American College of Nurse-Midwives: www.midwife.org This information is not intended to replace advice given to you by your health care provider. Make sure you discuss any questions you have with  your health care provider. Document Released: 11/20/2010 Document Revised: 01/31/2016 Document Reviewed: 08/24/2012 Elsevier Interactive Patient Education  2018 Elsevier Inc.  

## 2017-10-16 ENCOUNTER — Inpatient Hospital Stay (HOSPITAL_COMMUNITY): Payer: BLUE CROSS/BLUE SHIELD | Admitting: Anesthesiology

## 2017-10-16 ENCOUNTER — Inpatient Hospital Stay (HOSPITAL_COMMUNITY)
Admission: AD | Admit: 2017-10-16 | Discharge: 2017-10-18 | DRG: 806 | Disposition: A | Discharge status: Home / Self Care | Payer: BLUE CROSS/BLUE SHIELD

## 2017-10-16 ENCOUNTER — Encounter (HOSPITAL_COMMUNITY): Payer: Self-pay

## 2017-10-16 DIAGNOSIS — Z3A39 39 weeks gestation of pregnancy: Secondary | ICD-10-CM

## 2017-10-16 DIAGNOSIS — A6000 Herpesviral infection of urogenital system, unspecified: Secondary | ICD-10-CM | POA: Diagnosis not present

## 2017-10-16 DIAGNOSIS — O34219 Maternal care for unspecified type scar from previous cesarean delivery: Secondary | ICD-10-CM

## 2017-10-16 DIAGNOSIS — Z3483 Encounter for supervision of other normal pregnancy, third trimester: Secondary | ICD-10-CM | POA: Diagnosis not present

## 2017-10-16 DIAGNOSIS — O9832 Other infections with a predominantly sexual mode of transmission complicating childbirth: Secondary | ICD-10-CM | POA: Diagnosis not present

## 2017-10-16 DIAGNOSIS — O99284 Endocrine, nutritional and metabolic diseases complicating childbirth: Secondary | ICD-10-CM | POA: Diagnosis not present

## 2017-10-16 DIAGNOSIS — O34211 Maternal care for low transverse scar from previous cesarean delivery: Secondary | ICD-10-CM | POA: Diagnosis not present

## 2017-10-16 DIAGNOSIS — E7212 Methylenetetrahydrofolate reductase deficiency: Secondary | ICD-10-CM | POA: Diagnosis present

## 2017-10-16 LAB — CBC
HCT: 42.9 % (ref 36.0–46.0)
Hemoglobin: 14.8 g/dL (ref 12.0–15.0)
MCH: 32.7 pg (ref 26.0–34.0)
MCHC: 34.5 g/dL (ref 30.0–36.0)
MCV: 94.7 fL (ref 78.0–100.0)
PLATELETS: 192 10*3/uL (ref 150–400)
RBC: 4.53 MIL/uL (ref 3.87–5.11)
RDW: 12.9 % (ref 11.5–15.5)
WBC: 13.8 10*3/uL — ABNORMAL HIGH (ref 4.0–10.5)

## 2017-10-16 MED ORDER — FENTANYL 2.5 MCG/ML BUPIVACAINE 1/10 % EPIDURAL INFUSION (WH - ANES): 14.0000 mL/h | INTRAMUSCULAR | Status: DC | PRN

## 2017-10-16 MED ORDER — LIDOCAINE HCL (PF) 1 % IJ SOLN
INTRAMUSCULAR | Status: AC
  Filled 2017-10-16: qty 30

## 2017-10-16 MED ORDER — EPHEDRINE 5 MG/ML INJ
10.0000 mg | INTRAVENOUS | Status: DC | PRN
  Filled 2017-10-16: qty 2

## 2017-10-16 MED ORDER — ACETAMINOPHEN 325 MG PO TABS: 650.0000 mg | ORAL_TABLET | ORAL | Status: DC | PRN

## 2017-10-16 MED ORDER — OXYCODONE-ACETAMINOPHEN 5-325 MG PO TABS: 1.0000 | ORAL_TABLET | ORAL | Status: DC | PRN

## 2017-10-16 MED ORDER — PHENYLEPHRINE 40 MCG/ML (10ML) SYRINGE FOR IV PUSH (FOR BLOOD PRESSURE SUPPORT)
80.0000 ug | PREFILLED_SYRINGE | INTRAVENOUS | Status: DC | PRN
  Filled 2017-10-16: qty 5

## 2017-10-16 MED ORDER — OXYTOCIN BOLUS FROM INFUSION
500.0000 mL | Freq: Once | INTRAVENOUS | Status: AC
  Administered 2017-10-17: 500 mL via INTRAVENOUS

## 2017-10-16 MED ORDER — FENTANYL 2.5 MCG/ML BUPIVACAINE 1/10 % EPIDURAL INFUSION (WH - ANES)
INTRAMUSCULAR | Status: AC
  Filled 2017-10-16: qty 100

## 2017-10-16 MED ORDER — LACTATED RINGERS IV SOLN
INTRAVENOUS | Status: DC
  Administered 2017-10-16: via INTRAVENOUS

## 2017-10-16 MED ORDER — FLEET ENEMA 7-19 GM/118ML RE ENEM: 1.0000 | ENEMA | RECTAL | Status: DC | PRN

## 2017-10-16 MED ORDER — DIPHENHYDRAMINE HCL 50 MG/ML IJ SOLN: 12.5000 mg | INTRAMUSCULAR | Status: DC | PRN

## 2017-10-16 MED ORDER — LIDOCAINE HCL (PF) 1 % IJ SOLN
30.0000 mL | INTRAMUSCULAR | Status: DC | PRN
  Administered 2017-10-17: 30 mL via SUBCUTANEOUS
  Filled 2017-10-16: qty 30

## 2017-10-16 MED ORDER — OXYTOCIN 40 UNITS IN LACTATED RINGERS INFUSION - SIMPLE MED
INTRAVENOUS | Status: AC
  Filled 2017-10-16: qty 1000

## 2017-10-16 MED ORDER — PHENYLEPHRINE 40 MCG/ML (10ML) SYRINGE FOR IV PUSH (FOR BLOOD PRESSURE SUPPORT)
PREFILLED_SYRINGE | INTRAVENOUS | Status: AC
  Filled 2017-10-16: qty 10

## 2017-10-16 MED ORDER — OXYTOCIN 40 UNITS IN LACTATED RINGERS INFUSION - SIMPLE MED: 2.5000 [IU]/h | INTRAVENOUS | Status: DC

## 2017-10-16 MED ORDER — OXYCODONE-ACETAMINOPHEN 5-325 MG PO TABS: 2.0000 | ORAL_TABLET | ORAL | Status: DC | PRN

## 2017-10-16 MED ORDER — LACTATED RINGERS IV SOLN: 500.0000 mL | Freq: Once | INTRAVENOUS | Status: DC

## 2017-10-16 MED ORDER — SOD CITRATE-CITRIC ACID 500-334 MG/5ML PO SOLN: 30.0000 mL | ORAL | Status: DC | PRN

## 2017-10-16 MED ORDER — LACTATED RINGERS IV SOLN
500.0000 mL | Freq: Once | INTRAVENOUS | Status: AC
  Administered 2017-10-16: 500 mL via INTRAVENOUS

## 2017-10-16 MED ORDER — ONDANSETRON HCL 4 MG/2ML IJ SOLN: 4.0000 mg | Freq: Four times a day (QID) | INTRAMUSCULAR | Status: DC | PRN

## 2017-10-16 MED ORDER — LACTATED RINGERS IV SOLN: 500.0000 mL | INTRAVENOUS | Status: DC | PRN

## 2017-10-16 NOTE — H&P (Signed)
OB ADMISSION/ HISTORY & PHYSICAL:  Admission Date: 10/16/2017 10:45 PM  Admit Diagnosis: 39.5 WKS, CTX    Ashley Chapman is a 32 y.o. female presenting for painful ctx this evening. Denies LOF, + FM. Marland Kitchen.  Prenatal History: [id number]   EDC : 10/18/2017, by Other Basis  [facility], primary provider Ashley Chapman, CNM. Prenatal course complicated by Hx C/S for arrest of descent after several hour of pushing, hx HSV/no active lesions, on suppressive therapy, hx PEC, stopped bASA at 36 wks, and MTHFR mutation on methylfolate.  Cervical balloon for ripening done 4 days ago, latent labor in interim.   Prenatal Labs: ABO, Rh:  O neg  Antibody:  neg Rubella:   immune RPR:   neg HBsAg:   neg HIV:   neg GBS:   neg 1 hr Glucola : 91 Genetic Screening: declined Ultrasound: normal xx anatomy, anterior placenta  Medical / Surgical History :  Past medical history:  Past Medical History:  Diagnosis Date  . HSV (herpes simplex virus) anogenital infection   . Methylenetetrahydrofolate reductase (MTHFR) gene mutation [facility](HCC)      Past surgical history:  Past Surgical History:  Procedure Laterality Date  . CESAREAN SECTION N/A 02/25/2015   Procedure: CESAREAN SECTION;  Surgeon: Ashley Mackieichard Taavon, MD;  Location: [facility];  Service: Obstetrics;  Laterality: N/A;  . TONSILLECTOMY       Family History:  Family History  Problem Relation Age of Onset  . Cancer Mother        colon  . Endometriosis Mother      Social History:  reports that she has never smoked. She has never used smokeless tobacco. She reports that she does not drink alcohol or use drugs.   Allergies: Patient has no known allergies.   Current Medications at time of admission:  Medications Prior to Admission  Medication Sig Dispense Refill Last Dose  . acetaminophen (TYLENOL) 500 MG tablet Take 1,000 mg by mouth every 6 (six) hours as needed for headache.   Past Month at Unknown time  . cetirizine (ZYRTEC) 10 MG tablet Take  10 mg by mouth daily.   10/12/2017 at Unknown time  . EVENING PRIMROSE OIL PO Take by mouth.   10/12/2017 at Unknown time  . folic acid (FOLVITE) 1 MG tablet Take 1 tablet (1 mg total) by mouth daily. 90 tablet 1 More than a month at Unknown time  . Misc Natural Products (BUTCHERS BROOM PO) Take by mouth.   10/12/2017 at Unknown time  . Prenatal Vit-Fe Fumarate-FA (PRENATAL MULTIVITAMIN) TABS tablet Take 1 tablet by mouth daily at 12 noon.   10/12/2017 at Unknown time  . Probiotic Product (PROBIOTIC PO) Take 1 tablet by mouth daily.   Past Month at Unknown time  . valACYclovir (VALTREX) 1000 MG tablet Take 1,000 mg by mouth daily.   10/12/2017 at Unknown time     Review of Systems: ROS As noted above Physical Exam: Vital signs and nursing notes reviewed.  ED Triage Vitals  Enc Vitals Group     BP 10/16/17 2256 128/81     Pulse Rate 10/16/17 2256 60     Resp 10/16/17 2256 19     Temp 10/16/17 2256 97.9 F (36.6 C)     Temp Source 10/16/17 2342 Oral     SpO2 --      Weight 10/16/17 2256 197 lb 4 oz (89.5 kg)     Height 10/16/17 2256 5\' 5"  (1.651 m)  Head Circumference --      Peak Flow --      Pain Score 10/16/17 2258 8     Pain Loc --      Pain Edu? --      Excl. in GC? --      General: AAO x 3, NAD, coping well Heart: RRR Lungs:CTAB Abdomen: Gravid, NT, Leopold's vertex Extremities: trace edema Genitalia / VE: Dilation: 6 Effacement (%): 90 Station: -1 Presentation: Vertex Exam by:: [employer], RN    FHR: 120 BPM, mod variability, + accels, neg decels TOCO: Ctx q2-3 min, strong  Labs:   Pending T&S, CBC, RPR  CBC Latest Ref Rng & Units 10/16/2017  WBC 4.0 - 10.5 K/uL 13.8(H)  Hemoglobin 12.0 - 15.0 g/dL 16.1  Hematocrit 09.6 - 46.0 % 42.9  Platelets 150 - 400 K/uL 192      Assessment:  32 y.o. G4P1021 at [redacted]w[redacted]d for TOLAC  1. Active stage of labor 2. FHR category 1 3. GBS neg 4. Desires epidural 5. Breastfeeding 6. Placenta disposal per patient  request  Plan:  1. Admit to BS 2. Routine L&D orders 3. Analgesia/anesthesia PRN  4. Expectant management 5. Anticipate NSVB   Dr Amado Nash notified of admission / plan of care   Ashley Chapman CNM, MSN 10/16/2017, 11:45 PM

## 2017-10-16 NOTE — MAU Note (Signed)
CTX every 4-5 minutes.  No LOF/VB.  TOLAC.  + FM.

## 2017-10-17 ENCOUNTER — Encounter (HOSPITAL_COMMUNITY): Payer: Self-pay

## 2017-10-17 LAB — CBC
HCT: 37.1 % (ref 36.0–46.0)
HEMOGLOBIN: 12.9 g/dL (ref 12.0–15.0)
MCH: 32.8 pg (ref 26.0–34.0)
MCHC: 34.8 g/dL (ref 30.0–36.0)
MCV: 94.4 fL (ref 78.0–100.0)
Platelets: 178 10*3/uL (ref 150–400)
RBC: 3.93 MIL/uL (ref 3.87–5.11)
RDW: 12.7 % (ref 11.5–15.5)
WBC: 17 10*3/uL — ABNORMAL HIGH (ref 4.0–10.5)

## 2017-10-17 LAB — RPR: RPR Ser Ql: NONREACTIVE

## 2017-10-17 MED ORDER — BISACODYL 10 MG RE SUPP: 10.0000 mg | Freq: Every day | RECTAL | Status: DC | PRN

## 2017-10-17 MED ORDER — ONDANSETRON HCL 4 MG PO TABS: 4.0000 mg | ORAL_TABLET | ORAL | Status: DC | PRN

## 2017-10-17 MED ORDER — DIBUCAINE 1 % RE OINT: 1.0000 "application " | TOPICAL_OINTMENT | RECTAL | Status: DC | PRN

## 2017-10-17 MED ORDER — ACETAMINOPHEN 325 MG PO TABS
650.0000 mg | ORAL_TABLET | ORAL | Status: DC | PRN
Start: 2017-10-17 — End: 2017-10-18

## 2017-10-17 MED ORDER — DIPHENHYDRAMINE HCL 25 MG PO CAPS: 25.0000 mg | ORAL_CAPSULE | Freq: Four times a day (QID) | ORAL | Status: DC | PRN

## 2017-10-17 MED ORDER — COCONUT OIL OIL
1.0000 "application " | TOPICAL_OIL | Status: DC | PRN
  Administered 2017-10-18: 1 via TOPICAL
  Filled 2017-10-17: qty 120

## 2017-10-17 MED ORDER — PRENATAL MULTIVITAMIN CH
1.0000 | ORAL_TABLET | Freq: Every day | ORAL | Status: DC
  Administered 2017-10-17: 1 via ORAL
  Filled 2017-10-17: qty 1

## 2017-10-17 MED ORDER — RHO D IMMUNE GLOBULIN 1500 UNIT/2ML IJ SOSY
300.0000 ug | PREFILLED_SYRINGE | Freq: Once | INTRAMUSCULAR | Status: AC
  Administered 2017-10-17: 300 ug via INTRAVENOUS
  Filled 2017-10-17: qty 2

## 2017-10-17 MED ORDER — TETANUS-DIPHTH-ACELL PERTUSSIS 5-2.5-18.5 LF-MCG/0.5 IM SUSP: 0.5000 mL | Freq: Once | INTRAMUSCULAR | Status: DC

## 2017-10-17 MED ORDER — SIMETHICONE 80 MG PO CHEW: 80.0000 mg | CHEWABLE_TABLET | ORAL | Status: DC | PRN

## 2017-10-17 MED ORDER — IBUPROFEN 600 MG PO TABS
600.0000 mg | ORAL_TABLET | Freq: Four times a day (QID) | ORAL | Status: DC
  Administered 2017-10-17 – 2017-10-18 (×5): 600 mg via ORAL
  Filled 2017-10-17 (×4): qty 1

## 2017-10-17 MED ORDER — DOCUSATE SODIUM 100 MG PO CAPS
100.0000 mg | ORAL_CAPSULE | Freq: Two times a day (BID) | ORAL | Status: DC
  Administered 2017-10-18: 100 mg via ORAL
  Filled 2017-10-17: qty 1

## 2017-10-17 MED ORDER — ONDANSETRON HCL 4 MG/2ML IJ SOLN: 4.0000 mg | INTRAMUSCULAR | Status: DC | PRN

## 2017-10-17 MED ORDER — BENZOCAINE-MENTHOL 20-0.5 % EX AERO
1.0000 "application " | INHALATION_SPRAY | CUTANEOUS | Status: DC | PRN
  Administered 2017-10-17 – 2017-10-18 (×2): 1 via TOPICAL
  Filled 2017-10-17 (×2): qty 56

## 2017-10-17 MED ORDER — FLEET ENEMA 7-19 GM/118ML RE ENEM: 1.0000 | ENEMA | Freq: Every day | RECTAL | Status: DC | PRN

## 2017-10-17 MED ORDER — OXYCODONE HCL 5 MG PO TABS: 5.0000 mg | ORAL_TABLET | ORAL | Status: DC | PRN

## 2017-10-17 MED ORDER — WITCH HAZEL-GLYCERIN EX PADS: 1.0000 "application " | MEDICATED_PAD | CUTANEOUS | Status: DC | PRN

## 2017-10-17 NOTE — Anesthesia Preprocedure Evaluation (Deleted)
Anesthesia Evaluation    Airway        Dental   Pulmonary           Cardiovascular      Neuro/Psych    GI/Hepatic   Endo/Other    Renal/GU      Musculoskeletal   Abdominal   Peds  Hematology   Anesthesia Other Findings   Reproductive/Obstetrics                            Lab Results  Component Value Date   WBC 13.8 (H) 10/16/2017   HGB 14.8 10/16/2017   HCT 42.9 10/16/2017   MCV 94.7 10/16/2017   PLT 192 10/16/2017    Anesthesia Physical Anesthesia Plan  ASA: II  Anesthesia Plan: Epidural   Post-op Pain Management:    Induction:   PONV Risk Score and Plan:   Airway Management Planned:   Additional Equipment:   Intra-op Plan:   Post-operative Plan:   Informed Consent: I have reviewed the patients History and Physical, chart, labs and discussed the procedure including the risks, benefits and alternatives for the proposed anesthesia with the patient or authorized representative who has indicated his/her understanding and acceptance.     Plan Discussed with:   Anesthesia Plan Comments:         Anesthesia Quick Evaluation

## 2017-10-18 ENCOUNTER — Ambulatory Visit: Payer: Self-pay

## 2017-10-18 LAB — BIRTH TISSUE RECOVERY COLLECTION (PLACENTA DONATION)

## 2017-10-18 MED ORDER — IBUPROFEN 600 MG PO TABS: 600.0000 mg | ORAL_TABLET | Freq: Four times a day (QID) | ORAL | 0 refills | Status: AC

## 2017-10-18 MED ORDER — ACETAMINOPHEN 325 MG PO TABS: 650.0000 mg | ORAL_TABLET | ORAL | Status: DC | PRN

## 2017-10-18 MED ORDER — BENZOCAINE-MENTHOL 20-0.5 % EX AERO: 1.0000 "application " | INHALATION_SPRAY | CUTANEOUS | Status: DC | PRN

## 2017-10-18 MED ORDER — COCONUT OIL OIL: 1.0000 "application " | TOPICAL_OIL | 0 refills | Status: DC | PRN

## 2017-10-18 NOTE — Lactation Note (Signed)
This note was copied from a baby's chart. Lactation Consultation Note  Patient Name: Ashley Chapman ZOXWR'UToday's Date: 10/18/2017 Reason for consult: Initial assessment;Term P2, 27 hour female infant. Mom is experienced w/ BF, per mom, she  BF her son for one year. Does not want WIC at this time.  Mom has [employer]ra DEBP ([employer]) at home. Mom is knowledgeable  about STS, hand expressions and breast compressions.  Per mom, infant had 6 soiled diapers and one wet since delivery. LC enter room mom was feeding infant in cradle position, left breast,  LC observed latch infant had shallow suck. LC flanged top lip and lower bottom jaw infant had deeper latch and swallows heard.  Per mom, she could feel a difference in latch and could hear swallows.  [facility].  LC discussed I&O. Mom plans to feed according to hunger cues, 8 to 12 times within 24 hours including nights . Reviewed Baby & Me book's Breastfeeding Basics.  Mom made aware of O/P services, breastfeeding support groups, community resources, and our phone # for post-discharge questions.    Maternal Data Formula Feeding for Exclusion: No Has patient been taught Hand Expression?: Yes Does the patient have breastfeeding experience prior to this delivery?: Yes  Feeding Feeding Type: Breast Fed  LATCH Score Latch: Grasps breast easily, tongue down, lips flanged, rhythmical sucking.(LC lower bottom jaw for deeper latch )  Audible Swallowing: Spontaneous and intermittent  Type of Nipple: Everted at rest and after stimulation  Comfort (Breast/Nipple): Soft / non-tender  Hold (Positioning): Assistance needed to correctly position infant at breast and maintain latch.  LATCH Score: 9  Interventions Interventions: Breast feeding basics reviewed;Assisted with latch;Skin to skin;Breast massage;Breast compression  Lactation Tools Discussed/Used WIC Program: No(Per mom, she doesn't want WIC.  )   Consult Status Consult Status: Follow-up Date: 10/19/17 Follow-up type: In-patient    Danelle EarthlyRobin Jahdiel Krol 10/18/2017, 4:07 AM

## 2017-10-18 NOTE — Discharge Summary (Signed)
Obstetric Discharge Summary Reason for Admission: onset of labor and TOLAC Prenatal Procedures: ultrasound, cervical balloon for ripening Intrapartum Procedures: spontaneous vaginal delivery and VBAC Postpartum Procedures: Rubella Ig Complications-Operative and Postpartum: 2nd degree perineal laceration Hemoglobin  Date Value Ref Range Status  10/17/2017 12.9 12.0 - 15.0 g/dL Final   HCT  Date Value Ref Range Status  10/17/2017 37.1 36.0 - 46.0 % Final    Physical Exam:  General: alert, cooperative and no distress Lochia: appropriate Uterine Fundus: firm Incision: healing well DVT Evaluation: No cords or calf tenderness. No significant calf/ankle edema.  Discharge Diagnoses:  Patient Active Problem List   Diagnosis Date Noted  . VBAC 8/2 10/17/2017    Priority: Medium  . Postpartum care following vaginal delivery 02/25/2015  . Methylenetetrahydrofolate reductase (MTHFR) gene mutation [facility](HCC)      Discharge Information: Date: 10/18/2017 Activity: pelvic rest Diet: routine Medications:  Allergies as of 10/18/2017   No Known Allergies     Medication List    TAKE these medications   acetaminophen 325 MG tablet Commonly known as:  TYLENOL Take 2 tablets (650 mg total) by mouth every 4 (four) hours as needed (for pain scale < 4).   benzocaine-Menthol 20-0.5 % Aero Commonly known as:  DERMOPLAST Apply 1 application topically as needed for irritation (perineal discomfort).   coconut oil Oil Apply 1 application topically as needed.   ibuprofen 600 MG tablet Commonly known as:  ADVIL,MOTRIN Take 1 tablet (600 mg total) by mouth every 6 (six) hours.   prenatal multivitamin Tabs tablet Take 1 tablet by mouth daily at 12 noon.            Discharge Care Instructions  (From admission, onward)        Start     Ordered   10/18/17 0000  Discharge wound care:    Comments:  Sitz baths 2 times /day with warm water x 1 week   10/18/17 0849     Condition:  stable Instructions: refer to practice specific booklet Discharge to: home Follow-up Information    Marlinda MikeBailey, Tanya, CNM. Schedule an appointment as soon as possible for a visit in 6 week(s).   Specialty:  Obstetrics and Gynecology Contact information: [address] [place] [id number] [phone]           Newborn Data: Live born female Jerrye BeaversHazel Birth Weight: 7 lb 8.8 oz (3425 g) APGAR: 9, 9  Newborn Delivery   Birth date/time:  10/17/2017 00:33:00 Delivery type:  VBAC, Spontaneous     Home with mother.  Neta MendsDaniela C Paul, CNM 10/18/2017, 8:53 AM

## 2017-10-18 NOTE — Discharge Instructions (Signed)
Empty  Bladder before breastfeeding Retrain bladder by emptying bladder regularly Can start pelvic floor exercises at 2 weeks postpartum   Kegel Exercises Kegel exercises help strengthen the muscles that support the rectum, vagina, small intestine, bladder, and uterus. Doing Kegel exercises can help:  Improve bladder and bowel control.  Improve sexual response.  Reduce problems and discomfort during pregnancy.  Kegel exercises involve squeezing your pelvic floor muscles, which are the same muscles you squeeze when you try to stop the flow of urine. The exercises can be done while sitting, standing, or lying down, but it is best to vary your position. Phase 1 exercises 1. Squeeze your pelvic floor muscles tight. You should feel a tight lift in your rectal area. If you are a female, you should also feel a tightness in your vaginal area. Keep your stomach, buttocks, and legs relaxed. 2. Hold the muscles tight for up to 10 seconds. 3. Relax your muscles. Repeat this exercise 50 times a day or as many times as told by your health care provider. Continue to do this exercise for at least 4-6 weeks or for as long as told by your health care provider. This information is not intended to replace advice given to you by your health care provider. Make sure you discuss any questions you have with your health care provider. Document Released: 02/19/2012 Document Revised: 10/28/2015 Document Reviewed: 01/22/2015 Elsevier Interactive Patient Education  2018 [employer].  Kegel Exhaling, squeeze pelvic floor muscles as if to hold something in the vaginal opening. Inhaling, release. Can be done sitting, standing, side-lying, or knee-chest. Practice in a position that is comfortable and switch positions for variety. Repeat 10 times. Do 3 times per day.  Copyright  VHI. All rights reserved.   Vaginal Delivery, Care After Refer to this sheet in the next few weeks. These instructions provide you with  information about caring for yourself after vaginal delivery. Your health care provider may also give you more specific instructions. Your treatment has been planned according to current medical practices, but problems sometimes occur. Call your health care provider if you have any problems or questions. What can I expect after the procedure? After vaginal delivery, it is common to have:  Some bleeding from your vagina.  Soreness in your abdomen, your vagina, and the area of skin between your vaginal opening and your anus (perineum).  Pelvic cramps.  Fatigue.  Follow these instructions at home: Medicines  Take over-the-counter and prescription medicines only as told by your health care provider.  If you were prescribed an antibiotic medicine, take it as told by your health care provider. Do not stop taking the antibiotic until it is finished. Driving   Do not drive or operate heavy machinery while taking prescription pain medicine.  Do not drive for 24 hours if you received a sedative. Lifestyle  Do not drink alcohol. This is especially important if you are breastfeeding or taking medicine to relieve pain.  Do not use tobacco products, including cigarettes, chewing tobacco, or e-cigarettes. If you need help quitting, ask your health care provider. Eating and drinking  Drink at least 8 eight-ounce glasses of water every day unless you are told not to by your health care provider. If you choose to breastfeed your baby, you may need to drink more water than this.  Eat high-fiber foods every day. These foods may help prevent or relieve constipation. High-fiber foods include: ? Whole grain cereals and breads. ? Brown rice. ? Beans. ? Fresh  fruits and vegetables. Activity  Return to your normal activities as told by your health care provider. Ask your health care provider what activities are safe for you.  Rest as much as possible. Try to rest or take a nap when your baby is  sleeping.  Do not lift anything that is heavier than your baby or 10 lb (4.5 kg) until your health care provider says that it is safe.  Talk with your health care provider about when you can engage in sexual activity. This may depend on your: ? Risk of infection. ? Rate of healing. ? Comfort and desire to engage in sexual activity. Vaginal Care  If you have an episiotomy or a vaginal tear, check the area every day for signs of infection. Check for: ? More redness, swelling, or pain. ? More fluid or blood. ? Warmth. ? Pus or a bad smell.  Do not use tampons or douches until your health care provider says this is safe.  Watch for any blood clots that may pass from your vagina. These may look like clumps of dark red, brown, or black discharge. General instructions  Keep your perineum clean and dry as told by your health care provider.  Wear loose, comfortable clothing.  Wipe from front to back when you use the toilet.  Ask your health care provider if you can shower or take a bath. If you had an episiotomy or a perineal tear during labor and delivery, your health care provider may tell you not to take baths for a certain length of time.  Wear a bra that supports your breasts and fits you well.  If possible, have someone help you with household activities and help care for your baby for at least a few days after you leave the hospital.  Keep all follow-up visits for you and your baby as told by your health care provider. This is important. Contact a health care provider if:  You have: ? Vaginal discharge that has a bad smell. ? Difficulty urinating. ? Pain when urinating. ? A sudden increase or decrease in the frequency of your bowel movements. ? More redness, swelling, or pain around your episiotomy or vaginal tear. ? More fluid or blood coming from your episiotomy or vaginal tear. ? Pus or a bad smell coming from your episiotomy or vaginal tear. ? A fever. ? A  rash. ? Little or no interest in activities you used to enjoy. ? Questions about caring for yourself or your baby.  Your episiotomy or vaginal tear feels warm to the touch.  Your episiotomy or vaginal tear is separating or does not appear to be healing.  Your breasts are painful, hard, or turn red.  You feel unusually sad or worried.  You feel nauseous or you vomit.  You pass large blood clots from your vagina. If you pass a blood clot from your vagina, save it to show to your health care provider. Do not flush blood clots down the toilet without having your health care provider look at them.  You urinate more than usual.  You are dizzy or light-headed.  You have not breastfed at all and you have not had a menstrual period for 12 weeks after delivery.  You have stopped breastfeeding and you have not had a menstrual period for 12 weeks after you stopped breastfeeding. Get help right away if:  You have: ? Pain that does not go away or does not get better with medicine. ? Chest pain. ? Difficulty  breathing. ? Blurred vision or spots in your vision. ? Thoughts about hurting yourself or your baby.  You develop pain in your abdomen or in one of your legs.  You develop a severe headache.  You faint.  You bleed from your vagina so much that you fill two sanitary pads in one hour. This information is not intended to replace advice given to you by your health care provider. Make sure you discuss any questions you have with your health care provider. Document Released: 03/01/2000 Document Revised: 08/16/2015 Document Reviewed: 03/19/2015 Elsevier Interactive Patient Education  2018 [employer]

## 2017-10-18 NOTE — Progress Notes (Signed)
PPD # 1 S/P VBAC Information for the patient's newborn:  Ivan AnchorsJohnson, Girl Brandan [[id number]][[id number]]  female  Baby name: Jerrye BeaversHazel Feeding: breast  S:  Reports feeling well, desires DC home             Tolerating po/ No nausea or vomiting             Bleeding is decreased             Pain controlled with ibuprofen (OTC)             Up ad lib / ambulatory / voiding without difficulties   O:  A & O x 3, in no apparent distress              VS:  Vitals:   10/17/17 0930 10/17/17 1400 10/17/17 2242 10/18/17 0605  BP: 107/67 113/73 103/61 96/62  Pulse: 66 (!) 59 71 61  Resp: 18 16 18    Temp: 98.4 F (36.9 C) 98.2 F (36.8 C) 98.3 F (36.8 C) 97.6 F (36.4 C)  TempSrc: Oral Oral Oral Oral  SpO2:      Weight:      Height:        LABS:  Recent Labs    10/16/17 2335 10/17/17 0507  WBC 13.8* 17.0*  HGB 14.8 12.9  HCT 42.9 37.1  PLT 192 178    Blood type: --/--/O NEG (08/02 0507)  Rubella: Immune (01/29 0000)   I&O: I/O last 3 completed shifts: In: -  Out: 200 [Blood:200]          No intake/output data recorded.  Lungs: Clear and unlabored  Heart: regular rate and rhythm / no murmurs  Abdomen: soft, non-tender, non-distended             Fundus: firm, non-tender, U-1  Perineum: repair intact. Minimal edema  Lochia: small  Extremities: trace edema, no calf pain or tenderness    A/P: PPD # 1 32 y.o., [id number]   Principal Problem:   VBAC 8/2 Active Problems:   Postpartum care following vaginal delivery    Doing well - stable status  Routine post partum orders             DC home today w/ instructions  F/U at [facility] in 6 weeks and PRN     Neta Mendsaniela C Fain Francis, MSN, CNM 10/18/2017, 8:50 AM

## 2017-10-18 NOTE — Lactation Note (Addendum)
This note was copied from a baby's chart. Lactation Consultation Note  Patient Name: Ashley Chapman BJYNW'GToday's Date: 10/18/2017 Reason for consult: Follow-up assessment;Infant weight loss(early D/C , 7% weight loss/, Exp BF ) @22  hours old bili check 2.6 .  Baby is 7034 hours old  LC reviewed and updated the doc flow sheets per mom  Per mom baby recently breast fed at 10 30 for 15 mins, baby being held by dad and sleeping ready for D/C .  Per mom breast feeding is going well and breast are feeling fuller.  Mom denies soreness, sore nipple and engorgement prevention and tx.  LC discussed the importance if her breast are to full to start hand express off enough so the areola is compressible.  Before offering the 2nd breast, have baby feed on the 1st breast long enough to soften well.  Nutritive vs non - nutritive feeding patterns, and watch for hanging out latched.  STS feedings until the baby is back to birth weight and can stay awake for a feeding. 7% weight loss, and the importance of feeding with feeding cues and 8-12 x's a day.  LC instructed mom on the hand pump and also gave her #27 F in case she needs it when her milk comes in for over fullness.  Mom has a Spectra 2 at home.  Mother informed of post-discharge support and given phone number to the lactation department, including services for phone call assistance; out-patient appointments; and breastfeeding support group. List of other breastfeeding resources in the community given in the handout. Encouraged mother to call for problems or concerns related to breastfeeding.    Maternal Data Has patient been taught Hand Expression?: Yes  Feeding Feeding Type: (per mom last breast fed at 1030 for 15 mins ) Length of feed: 15 min(per mom / comfortable latch )  LATCH Score                   Interventions Interventions: Breast feeding basics reviewed;Hand pump  Lactation Tools Discussed/Used Tools: Pump;Flanges Flange  Size: 24;27 Breast pump type: Manual Pump Review: Setup, frequency, and cleaning;Milk Storage Initiated by:: MAI  Date initiated:: 10/18/17   Consult Status Consult Status: Complete Date: 10/18/17 Follow-up type: Physician(F/U 8/4 )    Ashley Chapman 10/18/2017, 11:13 AM

## 2017-10-19 LAB — RH IG WORKUP (INCLUDES ABO/RH)
ABO/RH(D): O NEG
Fetal Screen: NEGATIVE
GESTATIONAL AGE(WKS): 39
Unit division: 0

## 2017-10-20 LAB — BPAM RBC
BLOOD PRODUCT EXPIRATION DATE: 201908212359
Blood Product Expiration Date: 201908212359
UNIT TYPE AND RH: 9500
Unit Type and Rh: 9500

## 2017-10-20 LAB — TYPE AND SCREEN
ABO/RH(D): O NEG
Antibody Screen: POSITIVE
UNIT DIVISION: 0
Unit division: 0

## 2017-12-11 DIAGNOSIS — Z30430 Encounter for insertion of intrauterine contraceptive device: Secondary | ICD-10-CM | POA: Diagnosis not present

## 2017-12-11 DIAGNOSIS — Z3202 Encounter for pregnancy test, result negative: Secondary | ICD-10-CM | POA: Diagnosis not present

## 2018-01-09 DIAGNOSIS — Z23 Encounter for immunization: Secondary | ICD-10-CM | POA: Diagnosis not present

## 2018-01-09 DIAGNOSIS — Z30431 Encounter for routine checking of intrauterine contraceptive device: Secondary | ICD-10-CM | POA: Diagnosis not present

## 2018-04-29 DIAGNOSIS — R112 Nausea with vomiting, unspecified: Secondary | ICD-10-CM | POA: Diagnosis not present

## 2018-04-29 DIAGNOSIS — R509 Fever, unspecified: Secondary | ICD-10-CM | POA: Diagnosis not present

## 2018-04-29 DIAGNOSIS — R197 Diarrhea, unspecified: Secondary | ICD-10-CM | POA: Diagnosis not present

## 2019-02-17 ENCOUNTER — Other Ambulatory Visit: Payer: Self-pay

## 2019-02-17 DIAGNOSIS — Z20822 Contact with and (suspected) exposure to covid-19: Secondary | ICD-10-CM

## 2019-02-19 LAB — NOVEL CORONAVIRUS, NAA: SARS-CoV-2, NAA: NOT DETECTED

## 2019-02-23 ENCOUNTER — Telehealth: Payer: Self-pay

## 2019-02-23 NOTE — Telephone Encounter (Signed)
Caller given negative result and verbalized understanding  

## 2019-03-31 ENCOUNTER — Telehealth: Payer: Self-pay | Admitting: Gastroenterology

## 2019-03-31 ENCOUNTER — Ambulatory Visit (INDEPENDENT_AMBULATORY_CARE_PROVIDER_SITE_OTHER): Payer: BC Managed Care – PPO | Admitting: Gastroenterology

## 2019-03-31 ENCOUNTER — Encounter: Payer: Self-pay | Admitting: Gastroenterology

## 2019-03-31 VITALS — BP 96/70 | HR 74 | Temp 98.1°F | Ht 65.0 in | Wt 164.0 lb

## 2019-03-31 DIAGNOSIS — R1013 Epigastric pain: Secondary | ICD-10-CM | POA: Diagnosis not present

## 2019-03-31 MED ORDER — PANTOPRAZOLE SODIUM 40 MG PO TBEC: 40.0000 mg | DELAYED_RELEASE_TABLET | Freq: Every day | ORAL | 3 refills | Status: DC

## 2019-03-31 NOTE — Progress Notes (Addendum)
Referring Provider: Farris Has, MD Primary Care Physician:  Farris Has, MD  Reason for Consultation: Reflux   IMPRESSION:  Acute Intermittent epigastric pain    - symptoms at Thanksgiving 2020 and this week    - Associated burning, bloating, distension, early satiety, nausea IBS - diagnosed in [place] in 2010    - EGD and colonoscopy at that time    - patient relates symptoms to gluten sensitivity Rare ibuprofen for headaches Family history of colon cancer (mother at age 33) Family history of colon polyps (maternal grandmother)  Recent symptoms may be esophagitis, dyspepsia, H pylori, peptic ulcer disease, and even symptomatic hepatobiliary disease. Recommend trial of PPI. Reviewed GERD lifestyle modifications.  Abdominal ultrasound recommended. If negative, will proceed with HIDA to evaluate for symptomatic gallbladder disease. If symptoms persist empiric treatment and imaging studies are negative, will plan EGD and trial of FDGard.   I have recommended a colonoscopy for colon cancer screening starting at age 57 given her family history of colon cancer.    PLAN: Obtain records from [facility] Pantoprazole 40 mg QAM Hepatic function panel, lipase GERD lifestyle modifications Minimize Komubcha - it may exacerbate reflux Abdominal ultrasound Proceed with HIDA if ultrasound is negative Follow-up after the ultrasound Colonoscopy at age 26  Please see the "Patient Instructions" section for addition details about the plan.  HPI: Mariangel Ringley is a 34 y.o. female self-referred for reflux. She has a history of anxiety, depression, and nephrolithiasis. She works as a Engineer, materials at [employer].   Previously evaluated by [facility] in 2010 for abdominal pain evaluated with EGD and colonoscopy. Diagnosed with IBS. Patient tried elimination diet and discovered gluten sensitivity.   Severe, acute epigastric pain/burnning that radiates to the back that started 4 days ago.    Felt like trapped gas.  Associated burning, bloating, and distension.  Associated Early satiety, some nausea without vomiting Took Prilosec. Provided temporary relief. Awoke and the pain was still present. Doubled over with pain throughout the day. Worsened after lunch.  Took acid reducer with about 1 hour of relief. Returned but not as intense.  Sensation that she needs to burp.  Tried carbonated beverages without relief.  No dysphagia or odynophagia. Similar symptoms around Thanksgiving lasting 4 days.   Using Kombucha GTSynergy pineapple daily since then.  No change in bowel habits, blood in the stool.  No other associated symptoms. No identified exacerbating or relieving features.  Heartburn when pregnant requiring therapy  Rare ibuprofen for headaches. At most every 2 weeks.   Maternal grandmother with colon polyps. Mother with colon cancer at age 5. No other known family history of colon cancer or polyps. No family history of uterine/endometrial cancer, pancreatic cancer or gastric/stomach cancer.   Past Medical History:  Diagnosis Date  . HSV (herpes simplex virus) anogenital infection   . Methylenetetrahydrofolate reductase (MTHFR) gene mutation [facility])     Past Surgical History:  Procedure Laterality Date  . CESAREAN SECTION N/A 02/25/2015   Procedure: CESAREAN SECTION;  Surgeon: Olivia Mackie, MD;  Location: [facility];  Service: Obstetrics;  Laterality: N/A;  . TONSILLECTOMY      Current Outpatient Medications  Medication Sig Dispense Refill  . acetaminophen (TYLENOL) 325 MG tablet Take 2 tablets (650 mg total) by mouth every 4 (four) hours as needed (for pain scale < 4).    . benzocaine-Menthol (DERMOPLAST) 20-0.5 % AERO Apply 1 application topically as needed for irritation (perineal discomfort).    . coconut oil OIL  Apply 1 application topically as needed.  0  . ibuprofen (ADVIL,MOTRIN) 600 MG tablet Take 1 tablet (600 mg total) by mouth every 6 (six) hours. 30  tablet 0  . Prenatal Vit-Fe Fumarate-FA (PRENATAL MULTIVITAMIN) TABS tablet Take 1 tablet by mouth daily at 12 noon.     No current facility-administered medications for this visit.    Allergies as of 03/31/2019  . (No Known Allergies)    Family History  Problem Relation Age of Onset  . Cancer Mother        colon  . Endometriosis Mother     Social History   Socioeconomic History  . Marital status: Married    Spouse name: Not on file  . Number of children: Not on file  . Years of education: Not on file  . Highest education level: Not on file  Occupational History  . Not on file  Tobacco Use  . Smoking status: Never Smoker  . Smokeless tobacco: Never Used  Substance and Sexual Activity  . Alcohol use: No  . Drug use: No  . Sexual activity: Not on file  Other Topics Concern  . Not on file  Social History Narrative  . Not on file   Social Determinants of Health   Financial Resource Strain:   . Difficulty of Paying Living Expenses: Not on file  Food Insecurity:   . Worried About Charity fundraiser in the Last Year: Not on file  . Ran Out of Food in the Last Year: Not on file  Transportation Needs:   . Lack of Transportation (Medical): Not on file  . Lack of Transportation (Non-Medical): Not on file  Physical Activity:   . Days of Exercise per Week: Not on file  . Minutes of Exercise per Session: Not on file  Stress:   . Feeling of Stress : Not on file  Social Connections:   . Frequency of Communication with Friends and Family: Not on file  . Frequency of Social Gatherings with Friends and Family: Not on file  . Attends Religious Services: Not on file  . Active Member of Clubs or Organizations: Not on file  . Attends Archivist Meetings: Not on file  . Marital Status: Not on file  Intimate Partner Violence:   . Fear of Current or Ex-Partner: Not on file  . Emotionally Abused: Not on file  . Physically Abused: Not on file  . Sexually Abused: Not on  file    Review of Systems: 12 system ROS is negative except as noted above.   Physical Exam: General:   Alert,  well-nourished, pleasant and cooperative in NAD Head:  Normocephalic and atraumatic. Eyes:  Sclera clear, no icterus.   Conjunctiva pink. Ears:  Normal auditory acuity. Nose:  No deformity, discharge,  or lesions. Mouth:  No deformity or lesions.   Neck:  Supple; no masses or thyromegaly. Abdomen:  Soft, nontender, nondistended, normal bowel sounds, no rebound or guarding. No hepatosplenomegaly.   Rectal:  Deferred  Msk:  Symmetrical. No boney deformities LAD: No inguinal or umbilical LAD Extremities:  No clubbing or edema. Neurologic:  Alert and  oriented x4;  grossly nonfocal Skin: No rash or bruise.  Psych:  Alert and cooperative. Normal mood and affect.    Alejo Beamer L. Tarri Glenn, MD, MPH 03/31/2019, 10:04 AM

## 2019-03-31 NOTE — Telephone Encounter (Signed)
Rx sent to pharmacy   

## 2019-03-31 NOTE — Patient Instructions (Addendum)
I have recommended a trial of pantoprazole to try to control your symptoms.  Let's evaluate the gallbladder with an ultrasound. If the ultrasound is negative, we will move forward with a HIDA scan to test gallbladder function.   There are several things you can do to help your symptoms: - Take your pantoprazole 40 mg 30 minutes before breakfast every day - Avoid any dietary triggers - Avoid spicy and acidic foods - Limit your intake of coffee, tea, alcohol, and carbonated drinks - Work to maintain a healthy weight - Keep the head of the bed elevated with blocks if you are having any nighttime symptoms - Stay upright for 2 hours after eating - Avoid meals and snacks three to four hours before bedtime - Do not smoke   You have been scheduled for an abdominal ultrasound at [facility] (1st floor of hospital) on 04-05-19 at 10:30 AM . Please arrive 15 minutes prior to your appointment for registration. Make certain not to have anything to eat or drink 6 hours prior to your appointment. Should you need to reschedule your appointment, please contact radiology at [phone]. This test typically takes about 30 minutes to perform.

## 2019-04-05 ENCOUNTER — Other Ambulatory Visit: Payer: Self-pay

## 2019-04-05 ENCOUNTER — Ambulatory Visit (HOSPITAL_COMMUNITY)
Admission: RE | Admit: 2019-04-05 | Discharge: 2019-04-05 | Disposition: A | Discharge status: Home / Self Care | Payer: BC Managed Care – PPO | Source: Ambulatory Visit | Attending: Gastroenterology | Admitting: Gastroenterology

## 2019-04-05 DIAGNOSIS — R1013 Epigastric pain: Secondary | ICD-10-CM

## 2019-04-06 ENCOUNTER — Other Ambulatory Visit: Payer: Self-pay | Admitting: *Deleted

## 2019-04-06 ENCOUNTER — Encounter: Payer: Self-pay | Admitting: *Deleted

## 2019-04-06 DIAGNOSIS — R1013 Epigastric pain: Secondary | ICD-10-CM

## 2019-04-09 ENCOUNTER — Encounter: Payer: Self-pay | Admitting: *Deleted

## 2019-04-13 ENCOUNTER — Encounter (HOSPITAL_COMMUNITY): Admission: RE | Admit: 2019-04-13 | Payer: BC Managed Care – PPO | Source: Ambulatory Visit

## 2019-04-14 ENCOUNTER — Other Ambulatory Visit: Payer: Self-pay

## 2019-04-14 ENCOUNTER — Ambulatory Visit (HOSPITAL_COMMUNITY)
Admission: RE | Admit: 2019-04-14 | Discharge: 2019-04-14 | Disposition: A | Discharge status: Home / Self Care | Payer: BC Managed Care – PPO | Source: Ambulatory Visit | Attending: Gastroenterology | Admitting: Gastroenterology

## 2019-04-14 ENCOUNTER — Encounter: Payer: Self-pay | Admitting: *Deleted

## 2019-04-14 DIAGNOSIS — R109 Unspecified abdominal pain: Secondary | ICD-10-CM | POA: Diagnosis not present

## 2019-04-14 DIAGNOSIS — R1013 Epigastric pain: Secondary | ICD-10-CM | POA: Diagnosis not present

## 2019-04-14 MED ORDER — TECHNETIUM TC 99M MEBROFENIN IV KIT
5.3000 | PACK | Freq: Once | INTRAVENOUS | Status: AC | PRN
  Administered 2019-04-14: 5.3 via INTRAVENOUS

## 2019-04-19 DIAGNOSIS — L738 Other specified follicular disorders: Secondary | ICD-10-CM | POA: Diagnosis not present

## 2019-04-19 DIAGNOSIS — L309 Dermatitis, unspecified: Secondary | ICD-10-CM | POA: Diagnosis not present

## 2019-04-26 ENCOUNTER — Encounter: Payer: Self-pay | Admitting: Gastroenterology

## 2019-04-26 ENCOUNTER — Other Ambulatory Visit: Payer: Self-pay

## 2019-04-26 ENCOUNTER — Ambulatory Visit (INDEPENDENT_AMBULATORY_CARE_PROVIDER_SITE_OTHER): Payer: BC Managed Care – PPO | Admitting: Gastroenterology

## 2019-04-26 VITALS — BP 112/70 | HR 64 | Temp 97.9°F | Ht 65.0 in | Wt 168.0 lb

## 2019-04-26 DIAGNOSIS — R1013 Epigastric pain: Secondary | ICD-10-CM | POA: Diagnosis not present

## 2019-04-26 DIAGNOSIS — Z800 Family history of malignant neoplasm of digestive organs: Secondary | ICD-10-CM | POA: Diagnosis not present

## 2019-04-26 DIAGNOSIS — K589 Irritable bowel syndrome without diarrhea: Secondary | ICD-10-CM | POA: Diagnosis not present

## 2019-04-26 MED ORDER — FAMOTIDINE 20 MG PO TABS: 20.0000 mg | ORAL_TABLET | Freq: Two times a day (BID) | ORAL | 3 refills | Status: AC

## 2019-04-26 NOTE — Patient Instructions (Addendum)
If you are age 36 or older, your body mass index should be between 23-30. Your Body mass index is 27.96 kg/m. If this is out of the aforementioned range listed, please consider follow up with your Primary Care Provider.  If you are age 36 or younger, your body mass index should be between 19-25. Your Body mass index is 27.96 kg/m. If this is out of the aformentioned range listed, please consider follow up with your Primary Care Provider.   Due to recent changes in healthcare laws, you may see the results of your imaging and laboratory studies on MyChart before your provider has had a chance to review them.  We understand that in some cases there may be results that are confusing or concerning to you. Not all laboratory results come back in the same time frame and the provider may be waiting for multiple results in order to interpret others.  Please give [place] 48 hours in order for your provider to thoroughly review all the results before contacting the office for clarification of your results.   If your abdominal pain recurs, please try Pepcid AC twice daily. If this is not improving your pain, please call me.  Continue to watch your diet and avoid foods that can cause reflux.  Please add some fiber to your diet. I sent you home with FiberChoice samples today.  You should have a colonoscopy when you turn 34 given your family history.  Please let me know if you have any additional questions or concerns.

## 2019-04-26 NOTE — Progress Notes (Signed)
Referring Provider: London Pepper, MD Primary Care Physician:  London Pepper, MD  Reason for Consultation: Reflux   IMPRESSION:  Acute Intermittent epigastric pain    - symptoms at Thanksgiving 2020 and this week    - Associated burning, bloating, distension, early satiety, nausea    -TTG A1, IgA 87 on labs 05/24/2008    -Normal TSH 2010    Normal liver enzymes, amylase, lipase 2010 IBS - diagnosed in [place] in 2010    - EGD and colonoscopy at that time    - patient relates symptoms to gluten sensitivity History of globulin gap on labs from 2010    -Serum albumin 4.4, serum total protein 6.9 Rare ibuprofen for headaches Family history of colon cancer (mother at age 48) Family history of colon polyps (maternal grandmother)   Recurrent acute abdominal pain of unclear etiology suspect an acid peptic problem, dyspepsia, or esophagitis given her response to 2 weeks of PPI therapy.  I personally reviewed the images from her ultrasound and HIDA scan.  We discussed the results today.  Ultrasound showed gallbladder sludge.  The HIDA scan was normal.  Although she may have developed a rash to Protonix, she feels like she had enough doses to improve her symptoms.  No ongoing abdominal pain at this time.  I recommended that she try Pepcid 20 mg twice daily as needed for any recurrent symptoms.  We reviewed GERD lifestyle modifications.  If symptoms recur we will plan EGD for further evaluation and trial of IBgard.  History of IBS as documented in her prior evaluation in [place].  I recommended adding a daily fiber source such as Fiberchoice.  Samples provided today.  Family history of colon cancer.  I have recommended a colonoscopy for colon cancer screening starting at age 39 given her family history of colon cancer.    PLAN: Pepcid 20 mg BID PRN GERD lifestyle modifications Follow-up PRN, she will call me with any symptoms not responding to Lerna as a way to improve   Colonoscopy at age 79 Return to the clinic PRN  Please see the "Patient Instructions" section for addition details about the plan.  I spent 35 minutes of time, including in depth chart review, independent review of results as outlined above, communicating results with the patient directly, face-to-face time with the patient, coordinating care, and ordering studies and medications as appropriate, and documentation.  HPI: Ashley Chapman is a 34 y.o. female self-referred for reflux. She has a history of anxiety, depression, and nephrolithiasis. She works as a Estate agent at [employer].   Previously evaluated by [facility] in 2010 for abdominal pain evaluated with EGD and colonoscopy. Today I reviewed records from her care in [place].  She was seen in 2010 for abdominal pain, nausea, and diarrhea attributed to IBS after an extensive negative work-up including CBC, electrolytes, LFTs, pancreatic enzymes, celiac testing, and TSH.  Her EGD was normal except for a hiatal hernia.  Normal gastric and duodenal biopsies.  Colonoscopy was normal with normal random biopsies.  Symptoms somewhat improved with fiber and Kapidex.  Started Bentyl at the time of the colonoscopy for occasional postprandial abdominal pain.  She did not find the Bentyl helpful and stopped it.  Her diarrhea resolved on fiber.  Her pain improved.   Diagnosed with IBS. Patient tried elimination diet and discovered gluten sensitivity.   At the time of her consultation 03/31/19 she was reporting the acute onset of severe, acute epigastric pain/burnning that radiates to the  back with associated burning, bloating, distention, early satiety, nausea, and vomiting.  Prilosec provided temporary relief.  No dysphagia or odynophagia.  She had similar symptoms around Thanksgiving lasting 4 days. Took Prilosec. Provided temporary relief. Took an additional acid reducer with about 1 hour of relief. Returned but not as intense.  Was reporting failure rare  use of ibuprofen for headaches. At most every 2 weeks.   After her consultation she had an ultrasound and a HIDA scan with CCK. Abdominal ultrasound 04/05/19: sludge within the gallbladder neck HIDA with CCK 04/14/19: normal with EF 47%  Took 2 weeks of pantoprazole. Developed pruritic rash 4-5 days after starting pantoprazole. Resolved after stopping the pantoprazole and a silicone scrub brash.  No concurrent arthralgias. No prior history of drug rash. Dermatologist suggested that it could be eczema versus drug reaction.  Abdominal pain has since resolved.  She believes she may have had an off pantoprazole.  No new GI complaints at this time.  Overall feeling better.   Prior labs: Labs 05/24/2008 showed a normal comprehensive metabolic panel including normal liver enzymes with an AST 15, ALT 16, alk phos 47, total bilirubin 0.9, albumin 4.4, total protein 6.9, globulin gap 5.5, T TGA 1 IgA 87, TSH 2.61, GGT 19, amylase 62, lipase 27  Prior endoscopy: EGD and colonoscopy in [facility] 05/2008: hiatal hernia, normal gastric and duodenal biopsies, normal colonoscopy including normal random biopsies  Family history: Maternal grandmother with colon polyps. Mother with colon cancer at age 17. No other known family history of colon cancer or polyps. No family history of uterine/endometrial cancer, pancreatic cancer or gastric/stomach cancer.   Past Medical History:  Diagnosis Date  . Allergic rhinitis   . HSV (herpes simplex virus) anogenital infection   . IBS (irritable bowel syndrome)   . Methylenetetrahydrofolate reductase (MTHFR) gene mutation [facility])     Past Surgical History:  Procedure Laterality Date  . CESAREAN SECTION N/A 02/25/2015   Procedure: CESAREAN SECTION;  Surgeon: Brien Few, MD;  Location: [facility];  Service: Obstetrics;  Laterality: N/A;  . TONSILLECTOMY      Current Outpatient Medications  Medication Sig Dispense Refill  . cetirizine (ZYRTEC) 10 MG tablet Take 10 mg  by mouth as needed for allergies.    Marland Kitchen ibuprofen (ADVIL,MOTRIN) 600 MG tablet Take 1 tablet (600 mg total) by mouth every 6 (six) hours. 30 tablet 0  . Multiple Vitamin (MULTIVITAMIN) tablet Take 1 tablet by mouth daily.    . pantoprazole (PROTONIX) 40 MG tablet Take 1 tablet (40 mg total) by mouth daily. 30 tablet 3   No current facility-administered medications for this visit.    Allergies as of 04/26/2019  . (No Known Allergies)    Family History  Problem Relation Age of Onset  . Cancer Mother 58       colon  . Endometriosis Mother     Social History   Socioeconomic History  . Marital status: Married    Spouse name: Not on file  . Number of children: Not on file  . Years of education: Not on file  . Highest education level: Not on file  Occupational History  . Not on file  Tobacco Use  . Smoking status: Never Smoker  . Smokeless tobacco: Never Used  Substance and Sexual Activity  . Alcohol use: Yes    Alcohol/week: 2.0 standard drinks    Types: 2 Glasses of wine per week    Comment: weekends only  . Drug use: No  .  Sexual activity: Not on file  Other Topics Concern  . Not on file  Social History Narrative  . Not on file   Social Determinants of Health   Financial Resource Strain:   . Difficulty of Paying Living Expenses: Not on file  Food Insecurity:   . Worried About Charity fundraiser in the Last Year: Not on file  . Ran Out of Food in the Last Year: Not on file  Transportation Needs:   . Lack of Transportation (Medical): Not on file  . Lack of Transportation (Non-Medical): Not on file  Physical Activity:   . Days of Exercise per Week: Not on file  . Minutes of Exercise per Session: Not on file  Stress:   . Feeling of Stress : Not on file  Social Connections:   . Frequency of Communication with Friends and Family: Not on file  . Frequency of Social Gatherings with Friends and Family: Not on file  . Attends Religious Services: Not on file  . Active  Member of Clubs or Organizations: Not on file  . Attends Archivist Meetings: Not on file  . Marital Status: Not on file  Intimate Partner Violence:   . Fear of Current or Ex-Partner: Not on file  . Emotionally Abused: Not on file  . Physically Abused: Not on file  . Sexually Abused: Not on file     Physical Exam: General:   Alert,  well-nourished, pleasant and cooperative in NAD Head:  Normocephalic and atraumatic. Eyes:  Sclera clear, no icterus.   Conjunctiva pink. Abdomen:  Soft, nontender, nondistended, normal bowel sounds, no rebound or guarding. No hepatosplenomegaly.   Rectal:  Deferred  Msk:  Symmetrical. No obvious boney deformities Extremities:  No clubbing or edema. Neurologic:  Alert and  oriented x4;  grossly nonfocal Skin: No rash or bruise.  Psych:  Alert and cooperative. Normal mood and affect.    Garlin Batdorf L. Tarri Glenn, MD, MPH 04/26/2019, 9:53 AM

## 2019-08-24 ENCOUNTER — Telehealth: Payer: Self-pay | Admitting: Gastroenterology

## 2019-08-24 ENCOUNTER — Other Ambulatory Visit: Payer: Self-pay

## 2019-08-24 DIAGNOSIS — R1084 Generalized abdominal pain: Secondary | ICD-10-CM

## 2019-08-24 DIAGNOSIS — R1013 Epigastric pain: Secondary | ICD-10-CM

## 2019-08-24 NOTE — Telephone Encounter (Signed)
Replied to patient message via My Chart with information to reschedule CT scan.

## 2019-08-24 NOTE — Telephone Encounter (Signed)
Patient calling to reschedule CT 

## 2019-08-25 DIAGNOSIS — Z6826 Body mass index (BMI) 26.0-26.9, adult: Secondary | ICD-10-CM | POA: Diagnosis not present

## 2019-08-25 DIAGNOSIS — Z1151 Encounter for screening for human papillomavirus (HPV): Secondary | ICD-10-CM | POA: Diagnosis not present

## 2019-08-25 DIAGNOSIS — Z01419 Encounter for gynecological examination (general) (routine) without abnormal findings: Secondary | ICD-10-CM | POA: Diagnosis not present

## 2019-08-27 ENCOUNTER — Other Ambulatory Visit: Payer: Self-pay | Admitting: Obstetrics and Gynecology

## 2019-08-27 DIAGNOSIS — N644 Mastodynia: Secondary | ICD-10-CM

## 2019-08-30 ENCOUNTER — Other Ambulatory Visit: Payer: BC Managed Care – PPO

## 2019-09-07 ENCOUNTER — Other Ambulatory Visit: Payer: Self-pay

## 2019-09-07 ENCOUNTER — Ambulatory Visit (INDEPENDENT_AMBULATORY_CARE_PROVIDER_SITE_OTHER): Payer: BC Managed Care – PPO | Admitting: Gastroenterology

## 2019-09-07 ENCOUNTER — Ambulatory Visit (INDEPENDENT_AMBULATORY_CARE_PROVIDER_SITE_OTHER)
Admission: RE | Admit: 2019-09-07 | Discharge: 2019-09-07 | Disposition: A | Discharge status: Home / Self Care | Payer: BC Managed Care – PPO | Source: Ambulatory Visit | Attending: Gastroenterology | Admitting: Gastroenterology

## 2019-09-07 ENCOUNTER — Encounter: Payer: Self-pay | Admitting: Gastroenterology

## 2019-09-07 ENCOUNTER — Ambulatory Visit
Admission: RE | Admit: 2019-09-07 | Discharge: 2019-09-07 | Disposition: A | Discharge status: Home / Self Care | Payer: BC Managed Care – PPO | Source: Ambulatory Visit | Attending: Obstetrics and Gynecology | Admitting: Obstetrics and Gynecology

## 2019-09-07 VITALS — BP 110/70 | HR 76 | Ht 65.0 in | Wt 161.0 lb

## 2019-09-07 DIAGNOSIS — R922 Inconclusive mammogram: Secondary | ICD-10-CM | POA: Diagnosis not present

## 2019-09-07 DIAGNOSIS — N644 Mastodynia: Secondary | ICD-10-CM

## 2019-09-07 DIAGNOSIS — K589 Irritable bowel syndrome without diarrhea: Secondary | ICD-10-CM | POA: Diagnosis not present

## 2019-09-07 DIAGNOSIS — R1084 Generalized abdominal pain: Secondary | ICD-10-CM

## 2019-09-07 DIAGNOSIS — R1013 Epigastric pain: Secondary | ICD-10-CM | POA: Diagnosis not present

## 2019-09-07 MED ORDER — IOHEXOL 300 MG/ML  SOLN
100.0000 mL | Freq: Once | INTRAMUSCULAR | Status: AC | PRN
  Administered 2019-09-07: 100 mL via INTRAVENOUS

## 2019-09-07 MED ORDER — DICYCLOMINE HCL 20 MG PO TABS: 20.0000 mg | ORAL_TABLET | Freq: Four times a day (QID) | ORAL | 3 refills | Status: AC

## 2019-09-07 NOTE — Progress Notes (Signed)
Referring Provider: Farris Has, MD Primary Care Physician:  Farris Has, MD  Chief complaint: Abdominal pain   IMPRESSION:  Acute Intermittent epigastric pain    - symptoms at Thanksgiving 2020 and this week    - Associated burning, bloating, distension, early satiety, nausea Possible gallbladder sludge on ultrasound    - HIDA with CCK negative    - CT abd/pelvis negative IBS - diagnosed in [place] in 2010    - EGD and colonoscopy at that time    - patient relates symptoms to gluten sensitivity    - no improvement with Bentyl Reflux during pregnancy Rare ibuprofen for headaches Family history of colon cancer (mother at age 31) Family history of colon polyps (maternal grandmother)  Recurrent symptoms: Suspected IBS or functional pain given her prior GI evaluation in [place], ultrasound, HIDA, and CT scan as she has recurrent symptoms without alarm features.  Reviewed CT scan findings with the patient today. Symptoms are not classic for biliary colic. May caused/worsened by anxiety. Plans to review evaluation and treatment of anxiety with her primary care provider. Will add dicyclomine. Low treshold for EGD and trial of FDGard.   I have recommended a colonoscopy for colon cancer screening starting at age 26 given her family history of colon cancer.    PLAN: - Continue pantoprazole 40 mg QAM - Trial of dicyclomine 20 mg QID (#50 with 3 refills) - Reviewed diaphragmatic breathing for control of GI symptoms  - Address anxiety with primary care provider - EGD with recurrent symptoms - Consider surgical referral to consider symptomatic gallbladder disease    HPI: Ashley Chapman is a 34 y.o. female who returned in follow-up after another episode of acute abdominal pain.  She has a history of anxiety, depression, reflux during pregnancy, and nephrolithiasis. She works as a Engineer, materials at [employer].   Previously evaluated by [facility] in 2010 for abdominal pain evaluated  with EGD and colonoscopy. Diagnosed with IBS. Patient tried elimination diet and discovered gluten sensitivity. Trial of Bentyl at that time provided no relief.   Continues to have intermittent, severe, acute epigastric pain/burnning that radiates to the back that feels like trapped gas. Associated burning, bloating, early satiety, nausea, and distension.  One episode occurred after eating Freddys, but she is able to eat some greasy/fatty foods without symptoms.  No change in bowel habits, blood in the stool. No weight loss.   Was on vacation at the beach last week when another episode occurred. Prior to this, her last episode had been 6 months ago. She noted a possible association with anxiety and wonders if this may be driving her symptoms.   Using Pepcid AC, bland diet, and FiberChoice.   Called the office with her most recent symptoms and CT was arranged. CT performed this morning showed no cause for symptoms.    Loves her job but notes that it's tressful. Felt better while working every other week during Covid. Her boss is stress inducing.  Abdominal ultrasound 04/05/19: small amount of sludge possible in the gallbladder neck, otherwise unremarkable HIDA with CCK 04/14/19: Normal. GBEF 47% CT abd/pelvis 09/07/19: No acute abnormality   Maternal grandmother with colon polyps. Mother with colon cancer at age 60. No other known family history of colon cancer or polyps. No family history of uterine/endometrial cancer, pancreatic cancer or gastric/stomach cancer.   Past Medical History:  Diagnosis Date  . Allergic rhinitis   . HSV (herpes simplex virus) anogenital infection   . IBS (irritable bowel  syndrome)   . Methylenetetrahydrofolate reductase (MTHFR) gene mutation [facility])     Past Surgical History:  Procedure Laterality Date  . CESAREAN SECTION N/A 02/25/2015   Procedure: CESAREAN SECTION;  Surgeon: Olivia Mackie, MD;  Location: [facility];  Service: Obstetrics;  Laterality: N/A;  .  TONSILLECTOMY      Current Outpatient Medications  Medication Sig Dispense Refill  . cetirizine (ZYRTEC) 10 MG tablet Take 10 mg by mouth as needed for allergies.    . famotidine (PEPCID) 20 MG tablet Take 1 tablet (20 mg total) by mouth 2 (two) times daily. 60 tablet 3  . ibuprofen (ADVIL,MOTRIN) 600 MG tablet Take 1 tablet (600 mg total) by mouth every 6 (six) hours. 30 tablet 0  . Multiple Vitamin (MULTIVITAMIN) tablet Take 1 tablet by mouth daily.     No current facility-administered medications for this visit.    Allergies as of 09/07/2019  . (No Known Allergies)    Family History  Problem Relation Age of Onset  . Cancer Mother 67       colon  . Endometriosis Mother   . Prostate cancer Father     Social History   Socioeconomic History  . Marital status: Married    Spouse name: Not on file  . Number of children: Not on file  . Years of education: Not on file  . Highest education level: Not on file  Occupational History  . Not on file  Tobacco Use  . Smoking status: Never Smoker  . Smokeless tobacco: Never Used  Substance and Sexual Activity  . Alcohol use: Yes    Alcohol/week: 2.0 standard drinks    Types: 2 Glasses of wine per week    Comment: weekends only  . Drug use: No  . Sexual activity: Not on file  Other Topics Concern  . Not on file  Social History Narrative  . Not on file   Social Determinants of Health   Financial Resource Strain:   . Difficulty of Paying Living Expenses:   Food Insecurity:   . Worried About Programme researcher, broadcasting/film/video in the Last Year:   . Barista in the Last Year:   Transportation Needs:   . Freight forwarder (Medical):   Marland Kitchen Lack of Transportation (Non-Medical):   Physical Activity:   . Days of Exercise per Week:   . Minutes of Exercise per Session:   Stress:   . Feeling of Stress :   Social Connections:   . Frequency of Communication with Friends and Family:   . Frequency of Social Gatherings with Friends and  Family:   . Attends Religious Services:   . Active Member of Clubs or Organizations:   . Attends Banker Meetings:   Marland Kitchen Marital Status:   Intimate Partner Violence:   . Fear of Current or Ex-Partner:   . Emotionally Abused:   Marland Kitchen Physically Abused:   . Sexually Abused:     Physical Exam: General:   Alert,  well-nourished, pleasant and cooperative in NAD Head:  Normocephalic and atraumatic. Eyes:  Sclera clear, no icterus.   Conjunctiva pink. Ears:  Normal auditory acuity. Nose:  No deformity, discharge,  or lesions. Mouth:  No deformity or lesions.   Neck:  Supple; no masses or thyromegaly. Abdomen:  Soft, nontender, nondistended, normal bowel sounds, no rebound or guarding. No hepatosplenomegaly.   Rectal:  Deferred  Msk:  Symmetrical. No boney deformities LAD: No inguinal or umbilical LAD Extremities:  No clubbing  or edema. Neurologic:  Alert and  oriented x4;  grossly nonfocal Skin: No rash or bruise.  Psych:  Alert and cooperative. Normal mood and affect.    Kyngston Pickelsimer L. Tarri Glenn, MD, MPH 09/07/2019, 10:42 AM

## 2019-09-07 NOTE — Patient Instructions (Addendum)
I think that treating your anxiety may help with your GI symptoms. Hopefully, a new PCP will be helpful in guiding you with treatment.  In the meantime, I recommend a trial of dicyclomine with any recurrent abdominal pain.   We have sent the following medications to your pharmacy for you to pick up at your convenience:  Dicycolmine  Continue your Pantoprazole 40mg  every morning  As we discussed, belly breathing can help with abdominal pain. Sometimes this is called abdominal breathing or diaphragmatic breathing.   Sit upright in a chair. Your spine should be straight, knees bent, shoulders loose, and head and neck relaxed.  You mouth can be slightly open with teeth separated.   Place on hand on your chest and one hand on your abdomen, just below the rib cage.  Breathe in for 4 second through your nose and fee your stomach pushing out against your hand. Try to keep the hand on your chest from moving.   Hold your stomach muscles tight and then start to let your belly deflate as you exhale for 6 seconds. It helps to purse your lips on the exhale like you are blowing through a straw.   I like this video from the [place] [facility] [place] if you would like more information: [email or web address].[email or web address]  Please call or MyChart me at any time if you would like to proceed with upper endoscopy and/or see a surgeon.

## 2019-09-08 ENCOUNTER — Encounter: Payer: Self-pay | Admitting: Gastroenterology

## 2019-09-08 DIAGNOSIS — F411 Generalized anxiety disorder: Secondary | ICD-10-CM | POA: Diagnosis not present

## 2019-09-09 ENCOUNTER — Telehealth: Payer: Self-pay

## 2019-09-09 NOTE — Telephone Encounter (Signed)
Spoke to patient and told her that Dr. Lawerance Bach had agreed to see her.  She was very grateful and said "thank you" to Dr. Orvan Falconer for helping.

## 2019-09-09 NOTE — Telephone Encounter (Signed)
Thank you :)

## 2019-09-09 NOTE — Telephone Encounter (Signed)
-----   Message from Tressia Danas, MD sent at 09/07/2019 11:55 AM EDT ----- Verlon Au,  Would you please let the patient know that Dr. Lawerance Bach will see her!!!!  KLB ----- Message ----- From: Pincus Sanes, MD Sent: 09/07/2019  11:49 AM EDT To: Tressia Danas, MD  Yes, I can see her.  It's always nice to see nice, healthy patients!  Stacy ----- Message ----- From: Tressia Danas, MD Sent: 09/07/2019  11:07 AM EDT To: Pincus Sanes, MD  Stacy,  I hope you are doing well. I have been seeing a lovely young woman in clinic for epigastric pain. On her recent vacation at the beach, she realized that anxiety may be causing her symptoms. She would really like a new PCP to to consider treatment (previously followed at [facility] and interested in moving over to [facility]).  She is otherwise quite healthy and a woman with a big heart. Would you consider seeing her as a new patient? I know that you are busy. But, when I meet nice healthy people with treatable problems who want a good doctor, you come to mind!  Cala Bradford

## 2019-10-08 DIAGNOSIS — F411 Generalized anxiety disorder: Secondary | ICD-10-CM | POA: Diagnosis not present

## 2019-10-08 DIAGNOSIS — Z8659 Personal history of other mental and behavioral disorders: Secondary | ICD-10-CM | POA: Diagnosis not present

## 2019-10-14 DIAGNOSIS — Z20822 Contact with and (suspected) exposure to covid-19: Secondary | ICD-10-CM | POA: Diagnosis not present

## 2019-10-22 DIAGNOSIS — J0100 Acute maxillary sinusitis, unspecified: Secondary | ICD-10-CM | POA: Diagnosis not present

## 2019-10-22 DIAGNOSIS — Z299 Encounter for prophylactic measures, unspecified: Secondary | ICD-10-CM | POA: Diagnosis not present

## 2019-10-22 DIAGNOSIS — H6502 Acute serous otitis media, left ear: Secondary | ICD-10-CM | POA: Diagnosis not present

## 2020-01-26 DIAGNOSIS — Z20822 Contact with and (suspected) exposure to covid-19: Secondary | ICD-10-CM | POA: Diagnosis not present

## 2020-02-05 DIAGNOSIS — Z20822 Contact with and (suspected) exposure to covid-19: Secondary | ICD-10-CM | POA: Diagnosis not present

## 2020-02-22 DIAGNOSIS — Z20822 Contact with and (suspected) exposure to covid-19: Secondary | ICD-10-CM | POA: Diagnosis not present

## 2020-02-29 DIAGNOSIS — Z20822 Contact with and (suspected) exposure to covid-19: Secondary | ICD-10-CM | POA: Diagnosis not present

## 2020-03-08 DIAGNOSIS — Z20822 Contact with and (suspected) exposure to covid-19: Secondary | ICD-10-CM | POA: Diagnosis not present

## 2020-09-08 DIAGNOSIS — S4992XA Unspecified injury of left shoulder and upper arm, initial encounter: Secondary | ICD-10-CM | POA: Diagnosis not present

## 2020-09-25 ENCOUNTER — Encounter: Payer: Self-pay | Admitting: Gastroenterology

## 2020-09-25 NOTE — Progress Notes (Signed)
Subjective:    I'm seeing this patient as a consultation for:  Dr. Hyman Hopes. Note will be routed back to referring provider/PCP.  CC: L shoulder pain  I, Ashley Chapman, LAT, ATC, am serving as scribe for Dr. Clementeen Graham.  HPI: Pt is a 35 y/o female presenting w/ c/o L shoulder pain ongoing since 08/26/20. MOI: Pt went down a water slide and tried to reach back to stop herself.  She locates her pain to the anterior aspect of L shoulder.  Radiating pain: no L shoulder mechanical symptoms: no Aggravating factors: ABD over 90, shoulder flex, horizontal ADD Treatments tried: IBU, prednisone  Past medical history, Surgical history, Family history, Social history, Allergies, and medications have been entered into the medical record, reviewed.   Review of Systems: No new headache, visual changes, nausea, vomiting, diarrhea, constipation, dizziness, abdominal pain, skin rash, fevers, chills, night sweats, weight loss, swollen lymph nodes, body aches, joint swelling, muscle aches, chest pain, shortness of breath, mood changes, visual or auditory hallucinations.   Objective:    Vitals:   09/26/20 0836  BP: 116/78  Pulse: 60  SpO2: 100%   General: Well Developed, well nourished, and in no acute distress.  Neuro/Psych: Alert and oriented x3, extra-ocular muscles intact, able to move all 4 extremities, sensation grossly intact. Skin: Warm and dry, no rashes noted.  Respiratory: Not using accessory muscles, speaking in full sentences, trachea midline.  Cardiovascular: Pulses palpable, no extremity edema. Abdomen: Does not appear distended. MSK: Left shoulder normal-appearing Nontender. Normal motion pain with external rotation and abduction. Intact strength pain with resisted internal rotation and abduction. Positive Hawkins and Neer's test.  Negative Yergason's and speeds test. Equivocal O'Brien's test. Guarding with clunk test nondiagnostic. Pulses capillary refill and sensation are intact  distally.  Lab and Radiology Results  Diagnostic Limited MSK Ultrasound of: Left shoulder Biceps tendon intact normal-appearing Subscapularis tendon is intact and normal-appearing Supraspinatus tendon is intact with moderate subacromial bursitis. Infraspinatus tendon is intact. AC joint is normal appearing Impression: Subacromial bursitis.  No obvious rotator cuff tear visible.   Impression and Recommendations:    Assessment and Plan: 35 y.o. female with left shoulder pain ongoing for about 1 month.  Patient had her arm forcefully abducted and externally rotated while going down a slide.  I think it is possible she did have a subtle rotator cuff injury or even a possible labrum injury however on exam today should her exam is largely benign without severe ligament stability signs or weakness.  Plan to treat with physical therapy referral for shoulder stabilization.  Recheck in about 6 weeks.  If not improved would consider either injection or MRI arthrogram or both.  Additionally would proceed with an x-ray.  PDMP not reviewed this encounter. Orders Placed This Encounter  Procedures   [place] LIMITED JOINT SPACE STRUCTURES UP LEFT(NO LINKED CHARGES)    Order Specific Question:   Reason for Exam (SYMPTOM  OR DIAGNOSIS REQUIRED)    Answer:   L shoulder pain    Order Specific Question:   Preferred imaging location?    Answer:   [name] referral to Physical Therapy    Referral Priority:   Routine    Referral Type:   Physical Medicine    Referral Reason:   Specialty Services Required    Requested Specialty:   Physical Therapy    Number of Visits Requested:   1   No orders of the defined types were placed  in this encounter.   Discussed warning signs or symptoms. Please see discharge instructions. Patient expresses understanding.   The above documentation has been reviewed and is accurate and complete Ashley Chapman, M.D.

## 2020-09-26 ENCOUNTER — Other Ambulatory Visit: Payer: Self-pay

## 2020-09-26 ENCOUNTER — Ambulatory Visit: Payer: Self-pay

## 2020-09-26 ENCOUNTER — Ambulatory Visit (INDEPENDENT_AMBULATORY_CARE_PROVIDER_SITE_OTHER): Payer: BC Managed Care – PPO | Admitting: Family Medicine

## 2020-09-26 ENCOUNTER — Encounter: Payer: Self-pay | Admitting: Family Medicine

## 2020-09-26 VITALS — BP 116/78 | HR 60 | Ht 65.0 in | Wt 178.8 lb

## 2020-09-26 DIAGNOSIS — M25512 Pain in left shoulder: Secondary | ICD-10-CM | POA: Diagnosis not present

## 2020-09-26 NOTE — Patient Instructions (Signed)
Thank you for coming in today.   I've referred you to Physical Therapy.  Let us know if you don't hear from them in one week.   Recheck in about 6 weeks.   Let me know if this is not working.   We have more to do.   Next steps would be xray + injection or MRI or MRI arthrogram.

## 2020-10-12 DIAGNOSIS — Z20822 Contact with and (suspected) exposure to covid-19: Secondary | ICD-10-CM | POA: Diagnosis not present

## 2020-10-20 DIAGNOSIS — Z0000 Encounter for general adult medical examination without abnormal findings: Secondary | ICD-10-CM | POA: Diagnosis not present

## 2020-10-20 DIAGNOSIS — Z13220 Encounter for screening for lipoid disorders: Secondary | ICD-10-CM | POA: Diagnosis not present

## 2020-10-20 DIAGNOSIS — Z5181 Encounter for therapeutic drug level monitoring: Secondary | ICD-10-CM | POA: Diagnosis not present

## 2020-11-07 ENCOUNTER — Ambulatory Visit: Payer: BC Managed Care – PPO | Admitting: Family Medicine

## 2020-11-17 DIAGNOSIS — F988 Other specified behavioral and emotional disorders with onset usually occurring in childhood and adolescence: Secondary | ICD-10-CM | POA: Diagnosis not present

## 2021-01-12 IMAGING — MG DIGITAL DIAGNOSTIC BILAT W/ TOMO W/ CAD
6 of 10 series · 6 of 30 positions shown · non-contrast
Comparison: None.

CLINICAL DATA: Patient reports focal pain in lateral left breast,
which waxes and wanes with her menstrual cycle. No reported lumps.

EXAM:
DIGITAL DIAGNOSTIC BILATERAL MAMMOGRAM WITH CAD AND TOMO
ULTRASOUND LEFT BREAST

[L CC synth-2D]
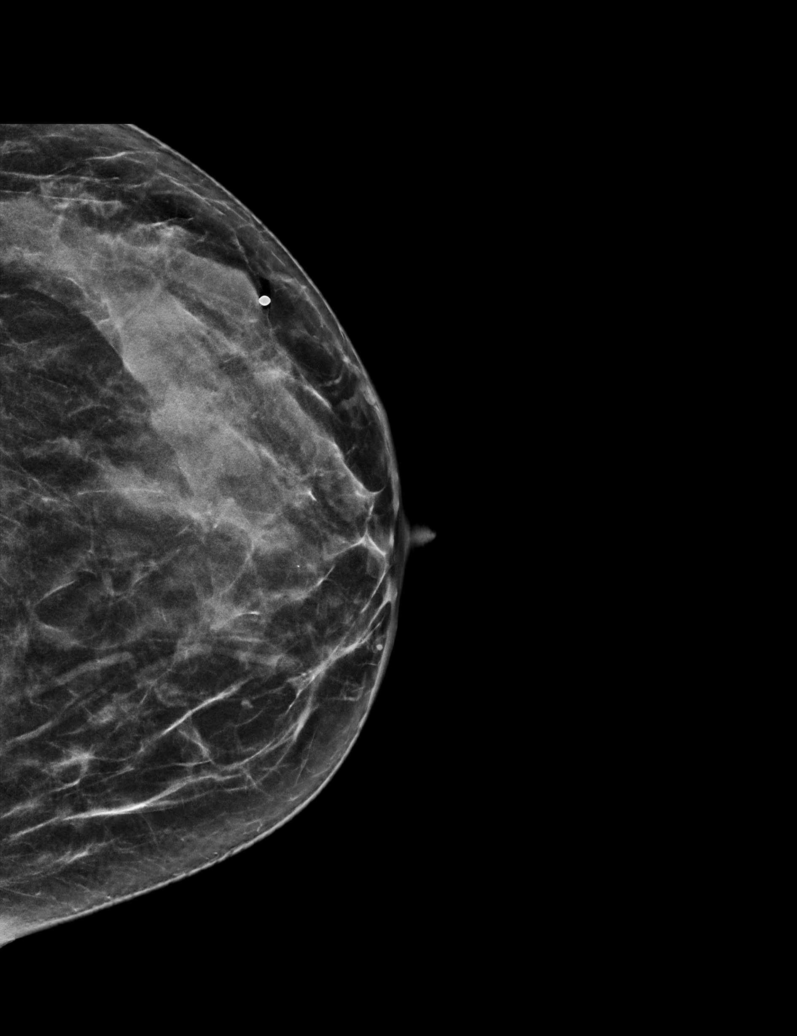

[R MLO synth-2D]
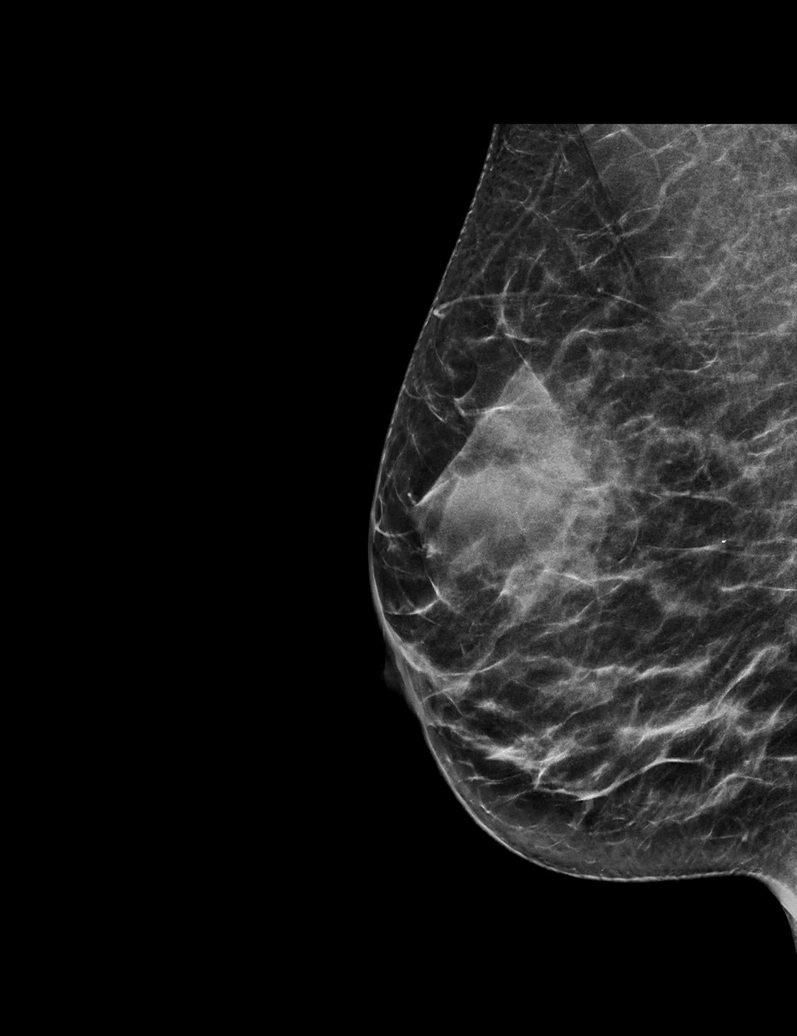

[L MLO synth-2D]
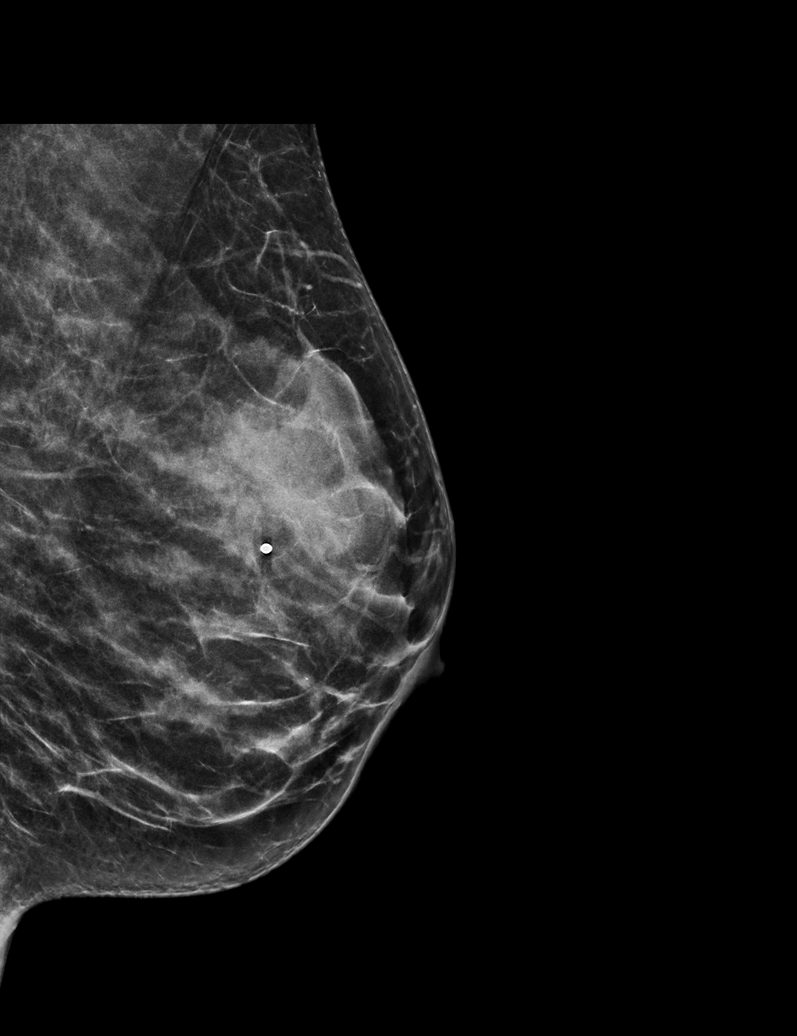

[L TAN synth-2D]
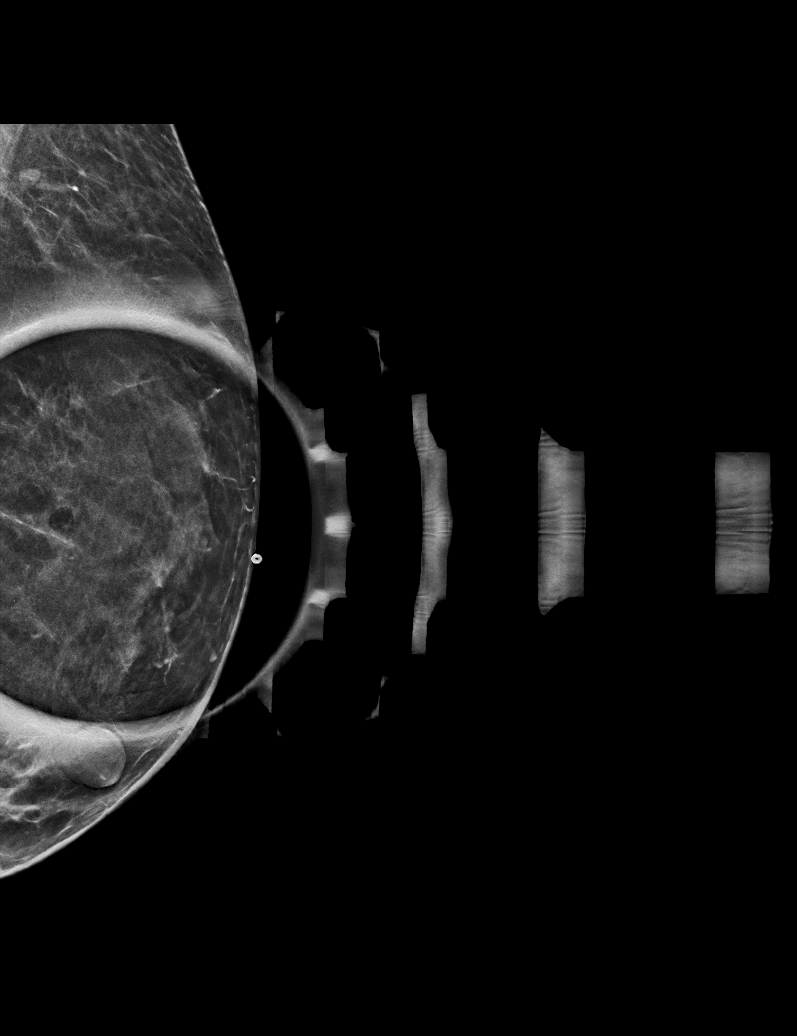

[R CC synth-2D]
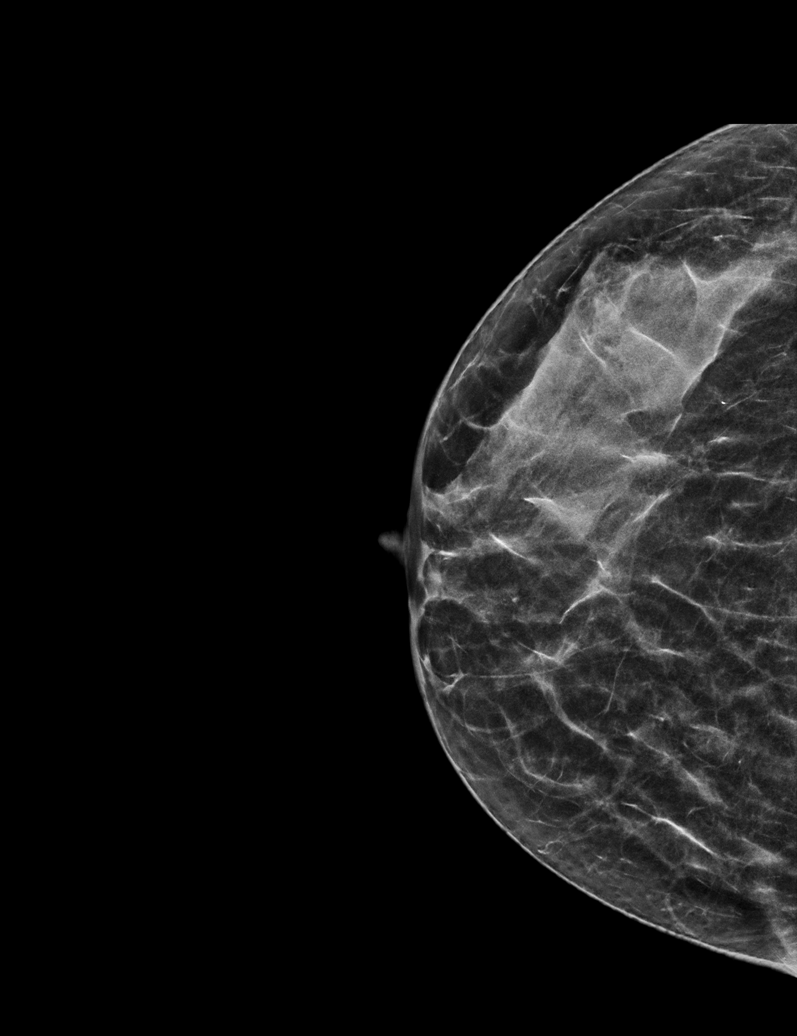

[R CC tomo · tomo slice 25/50.0]
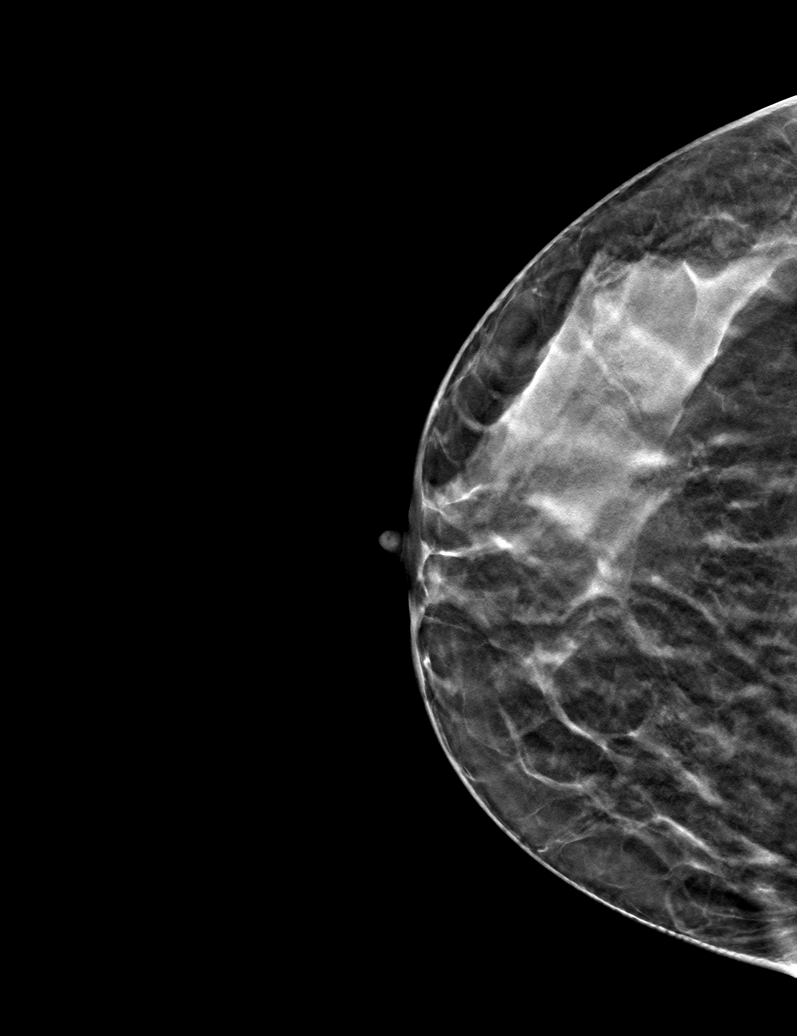

[6 of 30 positions shown; findings below may reference images not displayed]

ACR Breast Density Category c: The breast tissue is heterogeneously
dense, which may obscure small masses.
FINDINGS: There are no masses, areas of architectural distortion, areas of
significant asymmetry or suspicious calcifications.

Mammographic images were processed with CAD.

Targeted ultrasound is performed, showing normal tissue throughout
the lateral left breast in the area of the patient's focal pain. No
mass, cyst or suspicious lesion.
IMPRESSION: Negative exam.  No evidence of breast malignancy.

RECOMMENDATION:
Screening mammogram at age 38 unless there are persistent or
intervening clinical concerns. (Code:W9-E-2M8)

I have discussed the findings and recommendations with the patient.
If applicable, a reminder letter will be sent to the patient
regarding the next appointment.

BI-RADS CATEGORY  1: Negative.

## 2021-01-25 ENCOUNTER — Ambulatory Visit (INDEPENDENT_AMBULATORY_CARE_PROVIDER_SITE_OTHER): Payer: BC Managed Care – PPO | Admitting: Orthopaedic Surgery

## 2021-01-25 ENCOUNTER — Ambulatory Visit: Payer: Self-pay

## 2021-01-25 ENCOUNTER — Other Ambulatory Visit: Payer: Self-pay

## 2021-01-25 DIAGNOSIS — M79672 Pain in left foot: Secondary | ICD-10-CM | POA: Diagnosis not present

## 2021-01-25 MED ORDER — NAPROXEN 500 MG PO TABS: 500.0000 mg | ORAL_TABLET | Freq: Two times a day (BID) | ORAL | 3 refills | Status: AC

## 2021-01-25 NOTE — Progress Notes (Signed)
Office Visit Note   Patient: Ashley Chapman           Date of Birth: Mar 15, 1986           MRN: [id number] Visit Date: 01/25/2021              Requested by: Farris Has, MD [address] [place],  [place] [address] PCP: Farris Has, MD   Assessment & Plan: Visit Diagnoses:  1. Pain in left foot     Plan: Based on findings my impression is that she has some inflammation in the intrinsic muscles of the foot particularly between the first and second metatarsals.  I am not getting a sense that she has a structural problem or stress fracture.  I recommend supportive shoes and relative rest and 6 weeks anti-inflammatories.  I sent in Naprosyn for her to take and she should take acid reducer as well.  She will contact me if she continues to have problems after this course of conservative management.  Follow-Up Instructions: No follow-ups on file.   Orders:  Orders Placed This Encounter  Procedures   XR Foot Complete Left   Meds ordered this encounter  Medications   naproxen (NAPROSYN) 500 MG tablet    Sig: Take 1 tablet (500 mg total) by mouth 2 (two) times daily with a meal.    Dispense:  30 tablet    Refill:  3      Procedures: No procedures performed   Clinical Data: No additional findings.   Subjective: Chief Complaint  Patient presents with   Left Foot - Pain    Ashley Chapman is a very pleasant 35 year old female who comes in for evaluation of left foot pain for months without any injuries.  Pain is only when weightbearing.  She endorses pain that radiates up the foot from the intermetatarsal space between the first and second metatarsal.  It is worse with increased activity.  Pain is mainly a 4 out of 10 that is at worst 7 out of 10.  Denies any swelling or paresthesias or burning pain.  She is noticed some relief from wearing compression socks.   Review of Systems  Constitutional: Negative.   HENT: Negative.    Eyes: Negative.   Respiratory:  Negative.    Cardiovascular: Negative.   Endocrine: Negative.   Musculoskeletal: Negative.   Neurological: Negative.   Hematological: Negative.   Psychiatric/Behavioral: Negative.    All other systems reviewed and are negative.   Objective: Vital Signs: There were no vitals taken for this visit.  Physical Exam Vitals and nursing note reviewed.  Constitutional:      Appearance: She is well-developed.  Pulmonary:     Effort: Pulmonary effort is normal.  Skin:    General: Skin is warm.     Capillary Refill: Capillary refill takes less than 2 seconds.  Neurological:     Mental Status: She is alert and oriented to person, place, and time.  Psychiatric:        Behavior: Behavior normal.        Thought Content: Thought content normal.        Judgment: Judgment normal.    Ortho Exam  Left foot shows no masses lesions or swelling.  Negative Mulder's click.  No real bony tenderness.  Negative grind test of the great toe.  No tenderness of the sesamoids.  Plantar fascia and heel exam is unremarkable.  Normal range of motion of the toes.  Specialty Comments:  No specialty comments available.  Imaging: No results found.   PMFS History: Patient Active Problem List   Diagnosis Date Noted   VBAC 8/2 10/17/2017   Patient desires vaginal birth after cesarean section (VBAC) 10/12/2017   Postpartum care following vaginal delivery 02/25/2015   Methylenetetrahydrofolate reductase (MTHFR) gene mutation    Past Medical History:  Diagnosis Date   Allergic rhinitis    HSV (herpes simplex virus) anogenital infection    IBS (irritable bowel syndrome)    Methylenetetrahydrofolate reductase (MTHFR) gene mutation     Family History  Problem Relation Age of Onset   Cancer Mother 95       colon   Endometriosis Mother    Prostate cancer Father    Breast cancer Maternal Aunt     Past Surgical History:  Procedure Laterality Date   CESAREAN SECTION N/A 02/25/2015   Procedure: CESAREAN  SECTION;  Surgeon: Olivia Mackie, MD;  Location: [facility];  Service: Obstetrics;  Laterality: N/A;   TONSILLECTOMY     Social History   Occupational History   Not on file  Tobacco Use   Smoking status: Not on file   Smokeless tobacco: Never  Substance and Sexual Activity   Alcohol use: Yes    Alcohol/week: 2.0 standard drinks    Types: 2 Glasses of wine per week    Comment: weekends only   Drug use: No   Sexual activity: Not on file

## 2021-02-28 DIAGNOSIS — H6692 Otitis media, unspecified, left ear: Secondary | ICD-10-CM | POA: Diagnosis not present

## 2021-02-28 DIAGNOSIS — R0981 Nasal congestion: Secondary | ICD-10-CM | POA: Diagnosis not present

## 2021-03-28 DIAGNOSIS — F411 Generalized anxiety disorder: Secondary | ICD-10-CM | POA: Diagnosis not present

## 2021-03-28 DIAGNOSIS — F988 Other specified behavioral and emotional disorders with onset usually occurring in childhood and adolescence: Secondary | ICD-10-CM | POA: Diagnosis not present

## 2021-09-03 DIAGNOSIS — Z01419 Encounter for gynecological examination (general) (routine) without abnormal findings: Secondary | ICD-10-CM | POA: Diagnosis not present

## 2021-09-03 DIAGNOSIS — Z6831 Body mass index (BMI) 31.0-31.9, adult: Secondary | ICD-10-CM | POA: Diagnosis not present

## 2021-09-24 DIAGNOSIS — F988 Other specified behavioral and emotional disorders with onset usually occurring in childhood and adolescence: Secondary | ICD-10-CM | POA: Diagnosis not present

## 2021-09-24 DIAGNOSIS — F411 Generalized anxiety disorder: Secondary | ICD-10-CM | POA: Diagnosis not present

## 2021-12-13 DIAGNOSIS — R030 Elevated blood-pressure reading, without diagnosis of hypertension: Secondary | ICD-10-CM | POA: Diagnosis not present

## 2021-12-13 DIAGNOSIS — F988 Other specified behavioral and emotional disorders with onset usually occurring in childhood and adolescence: Secondary | ICD-10-CM | POA: Diagnosis not present

## 2021-12-13 DIAGNOSIS — Z0000 Encounter for general adult medical examination without abnormal findings: Secondary | ICD-10-CM | POA: Diagnosis not present

## 2021-12-13 DIAGNOSIS — K581 Irritable bowel syndrome with constipation: Secondary | ICD-10-CM | POA: Diagnosis not present

## 2021-12-13 DIAGNOSIS — Z13220 Encounter for screening for lipoid disorders: Secondary | ICD-10-CM | POA: Diagnosis not present

## 2021-12-13 DIAGNOSIS — F411 Generalized anxiety disorder: Secondary | ICD-10-CM | POA: Diagnosis not present

## 2021-12-13 DIAGNOSIS — Z23 Encounter for immunization: Secondary | ICD-10-CM | POA: Diagnosis not present

## 2022-02-20 DIAGNOSIS — Z20818 Contact with and (suspected) exposure to other bacterial communicable diseases: Secondary | ICD-10-CM | POA: Diagnosis not present

## 2022-02-20 DIAGNOSIS — L989 Disorder of the skin and subcutaneous tissue, unspecified: Secondary | ICD-10-CM | POA: Diagnosis not present

## 2022-06-02 DIAGNOSIS — J029 Acute pharyngitis, unspecified: Secondary | ICD-10-CM | POA: Diagnosis not present

## 2022-06-17 DIAGNOSIS — F988 Other specified behavioral and emotional disorders with onset usually occurring in childhood and adolescence: Secondary | ICD-10-CM | POA: Diagnosis not present

## 2022-06-17 DIAGNOSIS — F411 Generalized anxiety disorder: Secondary | ICD-10-CM | POA: Diagnosis not present

## 2022-12-20 DIAGNOSIS — Z23 Encounter for immunization: Secondary | ICD-10-CM | POA: Diagnosis not present

## 2022-12-20 DIAGNOSIS — Z0000 Encounter for general adult medical examination without abnormal findings: Secondary | ICD-10-CM | POA: Diagnosis not present

## 2022-12-23 DIAGNOSIS — D649 Anemia, unspecified: Secondary | ICD-10-CM | POA: Diagnosis not present

## 2022-12-23 DIAGNOSIS — Z13220 Encounter for screening for lipoid disorders: Secondary | ICD-10-CM | POA: Diagnosis not present

## 2022-12-23 DIAGNOSIS — E559 Vitamin D deficiency, unspecified: Secondary | ICD-10-CM | POA: Diagnosis not present

## 2022-12-23 DIAGNOSIS — R5383 Other fatigue: Secondary | ICD-10-CM | POA: Diagnosis not present

## 2022-12-26 DIAGNOSIS — F4321 Adjustment disorder with depressed mood: Secondary | ICD-10-CM | POA: Diagnosis not present

## 2023-01-01 DIAGNOSIS — F4321 Adjustment disorder with depressed mood: Secondary | ICD-10-CM | POA: Diagnosis not present

## 2023-01-16 DIAGNOSIS — F4321 Adjustment disorder with depressed mood: Secondary | ICD-10-CM | POA: Diagnosis not present

## 2023-02-03 DIAGNOSIS — F4321 Adjustment disorder with depressed mood: Secondary | ICD-10-CM | POA: Diagnosis not present

## 2023-02-17 DIAGNOSIS — F4321 Adjustment disorder with depressed mood: Secondary | ICD-10-CM | POA: Diagnosis not present

## 2023-05-13 DIAGNOSIS — F4321 Adjustment disorder with depressed mood: Secondary | ICD-10-CM | POA: Diagnosis not present

## 2023-05-22 DIAGNOSIS — E538 Deficiency of other specified B group vitamins: Secondary | ICD-10-CM | POA: Diagnosis not present

## 2023-05-22 DIAGNOSIS — D649 Anemia, unspecified: Secondary | ICD-10-CM | POA: Diagnosis not present

## 2023-05-22 DIAGNOSIS — E559 Vitamin D deficiency, unspecified: Secondary | ICD-10-CM | POA: Diagnosis not present

## 2023-05-22 DIAGNOSIS — R61 Generalized hyperhidrosis: Secondary | ICD-10-CM | POA: Diagnosis not present

## 2023-06-17 DIAGNOSIS — Z01419 Encounter for gynecological examination (general) (routine) without abnormal findings: Secondary | ICD-10-CM | POA: Diagnosis not present

## 2023-06-17 DIAGNOSIS — Z1331 Encounter for screening for depression: Secondary | ICD-10-CM | POA: Diagnosis not present

## 2023-06-17 DIAGNOSIS — Z0142 Encounter for cervical smear to confirm findings of recent normal smear following initial abnormal smear: Secondary | ICD-10-CM | POA: Diagnosis not present

## 2023-06-17 DIAGNOSIS — Z124 Encounter for screening for malignant neoplasm of cervix: Secondary | ICD-10-CM | POA: Diagnosis not present

## 2023-06-17 DIAGNOSIS — Z01411 Encounter for gynecological examination (general) (routine) with abnormal findings: Secondary | ICD-10-CM | POA: Diagnosis not present

## 2023-06-20 DIAGNOSIS — F988 Other specified behavioral and emotional disorders with onset usually occurring in childhood and adolescence: Secondary | ICD-10-CM | POA: Diagnosis not present

## 2023-06-20 DIAGNOSIS — F411 Generalized anxiety disorder: Secondary | ICD-10-CM | POA: Diagnosis not present

## 2023-06-20 DIAGNOSIS — J302 Other seasonal allergic rhinitis: Secondary | ICD-10-CM | POA: Diagnosis not present

## 2023-11-13 DIAGNOSIS — Z3202 Encounter for pregnancy test, result negative: Secondary | ICD-10-CM | POA: Diagnosis not present

## 2023-11-13 DIAGNOSIS — Z30433 Encounter for removal and reinsertion of intrauterine contraceptive device: Secondary | ICD-10-CM | POA: Diagnosis not present

## 2023-12-09 DIAGNOSIS — Z30431 Encounter for routine checking of intrauterine contraceptive device: Secondary | ICD-10-CM | POA: Diagnosis not present

## 2023-12-30 DIAGNOSIS — Z23 Encounter for immunization: Secondary | ICD-10-CM | POA: Diagnosis not present

## 2023-12-30 DIAGNOSIS — E611 Iron deficiency: Secondary | ICD-10-CM | POA: Diagnosis not present

## 2023-12-30 DIAGNOSIS — Z0000 Encounter for general adult medical examination without abnormal findings: Secondary | ICD-10-CM | POA: Diagnosis not present

## 2023-12-30 DIAGNOSIS — F411 Generalized anxiety disorder: Secondary | ICD-10-CM | POA: Diagnosis not present

## 2023-12-30 DIAGNOSIS — D649 Anemia, unspecified: Secondary | ICD-10-CM | POA: Diagnosis not present

## 2023-12-30 DIAGNOSIS — E559 Vitamin D deficiency, unspecified: Secondary | ICD-10-CM | POA: Diagnosis not present

## 2023-12-30 DIAGNOSIS — J302 Other seasonal allergic rhinitis: Secondary | ICD-10-CM | POA: Diagnosis not present

## 2023-12-30 DIAGNOSIS — Z13220 Encounter for screening for lipoid disorders: Secondary | ICD-10-CM | POA: Diagnosis not present

## 2023-12-30 DIAGNOSIS — F988 Other specified behavioral and emotional disorders with onset usually occurring in childhood and adolescence: Secondary | ICD-10-CM | POA: Diagnosis not present

## 2024-01-13 DIAGNOSIS — Z01818 Encounter for other preprocedural examination: Secondary | ICD-10-CM | POA: Diagnosis not present

## 2024-03-02 DIAGNOSIS — Z1211 Encounter for screening for malignant neoplasm of colon: Secondary | ICD-10-CM | POA: Diagnosis not present

## 2024-03-02 DIAGNOSIS — D124 Benign neoplasm of descending colon: Secondary | ICD-10-CM | POA: Diagnosis not present

## 2024-03-02 DIAGNOSIS — Z800 Family history of malignant neoplasm of digestive organs: Secondary | ICD-10-CM | POA: Diagnosis not present

## 2024-03-02 DIAGNOSIS — D128 Benign neoplasm of rectum: Secondary | ICD-10-CM | POA: Diagnosis not present

## 2024-03-02 DIAGNOSIS — Z83719 Family history of colon polyps, unspecified: Secondary | ICD-10-CM | POA: Diagnosis not present
